# Patient Record
Sex: Female | Born: 1935 | Race: White | Hispanic: No | Smoking: Never smoker
Health system: Southern US, Community
[De-identification: ages and names within clinical notes are randomized; demographics above are authoritative.]

## PROBLEM LIST (undated history)

## (undated) DIAGNOSIS — F329 Major depressive disorder, single episode, unspecified: Secondary | ICD-10-CM

## (undated) DIAGNOSIS — M6282 Rhabdomyolysis: Secondary | ICD-10-CM

## (undated) DIAGNOSIS — I10 Essential (primary) hypertension: Secondary | ICD-10-CM

## (undated) DIAGNOSIS — N182 Chronic kidney disease, stage 2 (mild): Secondary | ICD-10-CM

## (undated) DIAGNOSIS — F419 Anxiety disorder, unspecified: Secondary | ICD-10-CM

## (undated) DIAGNOSIS — Z86718 Personal history of other venous thrombosis and embolism: Secondary | ICD-10-CM

## (undated) DIAGNOSIS — R339 Retention of urine, unspecified: Secondary | ICD-10-CM

## (undated) DIAGNOSIS — R000 Tachycardia, unspecified: Secondary | ICD-10-CM

## (undated) DIAGNOSIS — N390 Urinary tract infection, site not specified: Secondary | ICD-10-CM

## (undated) DIAGNOSIS — K5790 Diverticulosis of intestine, part unspecified, without perforation or abscess without bleeding: Secondary | ICD-10-CM

## (undated) DIAGNOSIS — L0390 Cellulitis, unspecified: Secondary | ICD-10-CM

## (undated) DIAGNOSIS — R55 Syncope and collapse: Secondary | ICD-10-CM

## (undated) DIAGNOSIS — J189 Pneumonia, unspecified organism: Secondary | ICD-10-CM

## (undated) DIAGNOSIS — I639 Cerebral infarction, unspecified: Secondary | ICD-10-CM

## (undated) DIAGNOSIS — F32A Depression, unspecified: Secondary | ICD-10-CM

## (undated) DIAGNOSIS — F0390 Unspecified dementia without behavioral disturbance: Secondary | ICD-10-CM

## (undated) DIAGNOSIS — N179 Acute kidney failure, unspecified: Secondary | ICD-10-CM

## (undated) DIAGNOSIS — F418 Other specified anxiety disorders: Secondary | ICD-10-CM

## (undated) HISTORY — PX: OVARIAN CYST REMOVAL: SHX89

## (undated) HISTORY — PX: TOTAL HIP ARTHROPLASTY: SHX124

## (undated) HISTORY — PX: OTHER SURGICAL HISTORY: SHX169

## (undated) HISTORY — PX: HEMORROIDECTOMY: SUR656

---

## 1997-05-16 ENCOUNTER — Ambulatory Visit (HOSPITAL_COMMUNITY): Admission: RE | Admit: 1997-05-16 | Discharge: 1997-05-16 | Payer: Self-pay | Admitting: Gastroenterology

## 1999-01-06 ENCOUNTER — Ambulatory Visit (HOSPITAL_COMMUNITY): Admission: RE | Admit: 1999-01-06 | Discharge: 1999-01-06 | Payer: Self-pay | Admitting: Gastroenterology

## 1999-04-06 ENCOUNTER — Encounter: Payer: Self-pay | Admitting: General Surgery

## 1999-04-08 ENCOUNTER — Encounter (INDEPENDENT_AMBULATORY_CARE_PROVIDER_SITE_OTHER): Payer: Self-pay

## 1999-04-08 ENCOUNTER — Inpatient Hospital Stay (HOSPITAL_COMMUNITY): Admission: RE | Admit: 1999-04-08 | Discharge: 1999-04-08 | Payer: Self-pay | Admitting: General Surgery

## 2000-08-04 ENCOUNTER — Other Ambulatory Visit: Admission: RE | Admit: 2000-08-04 | Discharge: 2000-08-04 | Payer: Self-pay | Admitting: Obstetrics and Gynecology

## 2000-09-04 ENCOUNTER — Ambulatory Visit (HOSPITAL_COMMUNITY): Admission: RE | Admit: 2000-09-04 | Discharge: 2000-09-04 | Payer: Self-pay | Admitting: Obstetrics and Gynecology

## 2000-11-15 ENCOUNTER — Encounter: Payer: Self-pay | Admitting: Internal Medicine

## 2000-11-15 ENCOUNTER — Ambulatory Visit (HOSPITAL_COMMUNITY): Admission: RE | Admit: 2000-11-15 | Discharge: 2000-11-15 | Payer: Self-pay | Admitting: Internal Medicine

## 2001-05-18 ENCOUNTER — Ambulatory Visit (HOSPITAL_COMMUNITY): Admission: RE | Admit: 2001-05-18 | Discharge: 2001-05-18 | Payer: Self-pay | Admitting: Internal Medicine

## 2001-05-18 ENCOUNTER — Encounter: Payer: Self-pay | Admitting: Internal Medicine

## 2001-08-13 ENCOUNTER — Encounter: Payer: Self-pay | Admitting: Internal Medicine

## 2001-08-13 ENCOUNTER — Ambulatory Visit (HOSPITAL_COMMUNITY): Admission: RE | Admit: 2001-08-13 | Discharge: 2001-08-13 | Payer: Self-pay | Admitting: Internal Medicine

## 2001-11-06 ENCOUNTER — Encounter (HOSPITAL_COMMUNITY): Admission: RE | Admit: 2001-11-06 | Discharge: 2001-12-06 | Payer: Self-pay | Admitting: Neurological Surgery

## 2001-11-15 ENCOUNTER — Encounter: Payer: Self-pay | Admitting: Internal Medicine

## 2001-11-15 ENCOUNTER — Ambulatory Visit (HOSPITAL_COMMUNITY): Admission: RE | Admit: 2001-11-15 | Discharge: 2001-11-15 | Payer: Self-pay | Admitting: Internal Medicine

## 2001-12-20 ENCOUNTER — Encounter: Admission: RE | Admit: 2001-12-20 | Discharge: 2001-12-20 | Payer: Self-pay | Admitting: Orthopedic Surgery

## 2001-12-20 ENCOUNTER — Encounter: Payer: Self-pay | Admitting: Orthopedic Surgery

## 2002-01-10 ENCOUNTER — Encounter: Payer: Self-pay | Admitting: Orthopedic Surgery

## 2002-01-10 ENCOUNTER — Encounter: Admission: RE | Admit: 2002-01-10 | Discharge: 2002-01-10 | Payer: Self-pay | Admitting: Orthopedic Surgery

## 2002-01-24 ENCOUNTER — Encounter: Admission: RE | Admit: 2002-01-24 | Discharge: 2002-01-24 | Payer: Self-pay | Admitting: Orthopedic Surgery

## 2002-01-24 ENCOUNTER — Encounter: Payer: Self-pay | Admitting: Orthopedic Surgery

## 2002-06-06 ENCOUNTER — Ambulatory Visit (HOSPITAL_COMMUNITY): Admission: RE | Admit: 2002-06-06 | Discharge: 2002-06-06 | Payer: Self-pay | Admitting: Neurological Surgery

## 2002-06-06 ENCOUNTER — Encounter: Payer: Self-pay | Admitting: Neurological Surgery

## 2002-06-24 ENCOUNTER — Inpatient Hospital Stay (HOSPITAL_COMMUNITY): Admission: RE | Admit: 2002-06-24 | Discharge: 2002-06-27 | Payer: Self-pay | Admitting: Neurological Surgery

## 2002-06-24 ENCOUNTER — Encounter: Payer: Self-pay | Admitting: Neurological Surgery

## 2002-11-20 ENCOUNTER — Ambulatory Visit (HOSPITAL_COMMUNITY): Admission: RE | Admit: 2002-11-20 | Discharge: 2002-11-20 | Payer: Self-pay | Admitting: Internal Medicine

## 2002-11-20 ENCOUNTER — Encounter: Payer: Self-pay | Admitting: Internal Medicine

## 2003-11-24 ENCOUNTER — Ambulatory Visit (HOSPITAL_COMMUNITY): Admission: RE | Admit: 2003-11-24 | Discharge: 2003-11-24 | Payer: Self-pay | Admitting: Internal Medicine

## 2004-12-22 ENCOUNTER — Ambulatory Visit (HOSPITAL_COMMUNITY): Admission: RE | Admit: 2004-12-22 | Discharge: 2004-12-22 | Payer: Self-pay | Admitting: Internal Medicine

## 2006-01-20 ENCOUNTER — Ambulatory Visit (HOSPITAL_COMMUNITY): Admission: RE | Admit: 2006-01-20 | Discharge: 2006-01-20 | Payer: Self-pay | Admitting: Family Medicine

## 2006-07-11 ENCOUNTER — Ambulatory Visit: Payer: Self-pay | Admitting: Internal Medicine

## 2006-07-24 ENCOUNTER — Ambulatory Visit: Payer: Self-pay | Admitting: Internal Medicine

## 2006-07-24 ENCOUNTER — Ambulatory Visit (HOSPITAL_COMMUNITY): Admission: RE | Admit: 2006-07-24 | Discharge: 2006-07-24 | Payer: Self-pay | Admitting: Internal Medicine

## 2006-09-14 ENCOUNTER — Ambulatory Visit (HOSPITAL_COMMUNITY): Admission: RE | Admit: 2006-09-14 | Discharge: 2006-09-14 | Payer: Self-pay | Admitting: Family Medicine

## 2006-09-14 ENCOUNTER — Encounter: Payer: Self-pay | Admitting: Orthopedic Surgery

## 2006-09-19 ENCOUNTER — Ambulatory Visit: Payer: Self-pay | Admitting: Urgent Care

## 2006-09-26 ENCOUNTER — Ambulatory Visit: Payer: Self-pay | Admitting: Internal Medicine

## 2006-09-28 ENCOUNTER — Ambulatory Visit (HOSPITAL_COMMUNITY): Admission: RE | Admit: 2006-09-28 | Discharge: 2006-09-28 | Payer: Self-pay | Admitting: Internal Medicine

## 2006-09-28 ENCOUNTER — Ambulatory Visit: Payer: Self-pay | Admitting: Internal Medicine

## 2006-10-06 ENCOUNTER — Ambulatory Visit: Payer: Self-pay | Admitting: Internal Medicine

## 2006-11-07 ENCOUNTER — Ambulatory Visit: Payer: Self-pay | Admitting: Internal Medicine

## 2007-01-08 ENCOUNTER — Ambulatory Visit (HOSPITAL_COMMUNITY): Admission: RE | Admit: 2007-01-08 | Discharge: 2007-01-08 | Payer: Self-pay | Admitting: Internal Medicine

## 2007-01-11 ENCOUNTER — Ambulatory Visit: Payer: Self-pay | Admitting: Internal Medicine

## 2007-01-12 ENCOUNTER — Ambulatory Visit (HOSPITAL_COMMUNITY): Admission: RE | Admit: 2007-01-12 | Discharge: 2007-01-12 | Payer: Self-pay | Admitting: Internal Medicine

## 2007-02-15 ENCOUNTER — Ambulatory Visit (HOSPITAL_COMMUNITY): Admission: RE | Admit: 2007-02-15 | Discharge: 2007-02-15 | Payer: Self-pay | Admitting: Family Medicine

## 2007-04-12 ENCOUNTER — Encounter: Payer: Self-pay | Admitting: Orthopedic Surgery

## 2007-04-12 ENCOUNTER — Ambulatory Visit (HOSPITAL_COMMUNITY): Admission: RE | Admit: 2007-04-12 | Discharge: 2007-04-12 | Payer: Self-pay | Admitting: Family Medicine

## 2007-08-06 ENCOUNTER — Ambulatory Visit: Payer: Self-pay | Admitting: Orthopedic Surgery

## 2007-08-06 DIAGNOSIS — M1610 Unilateral primary osteoarthritis, unspecified hip: Secondary | ICD-10-CM | POA: Insufficient documentation

## 2007-08-06 DIAGNOSIS — M169 Osteoarthritis of hip, unspecified: Secondary | ICD-10-CM

## 2007-08-06 DIAGNOSIS — M25559 Pain in unspecified hip: Secondary | ICD-10-CM

## 2008-02-11 ENCOUNTER — Inpatient Hospital Stay (HOSPITAL_COMMUNITY): Admission: RE | Admit: 2008-02-11 | Discharge: 2008-02-17 | Payer: Self-pay | Admitting: Orthopedic Surgery

## 2008-02-15 ENCOUNTER — Ambulatory Visit: Payer: Self-pay | Admitting: Internal Medicine

## 2010-02-28 ENCOUNTER — Encounter: Payer: Self-pay | Admitting: Internal Medicine

## 2010-05-24 LAB — RETICULOCYTES: Retic Ct Pct: 2.4 % (ref 0.4–3.1)

## 2010-05-24 LAB — CBC
HCT: 18.5 % — ABNORMAL LOW (ref 36.0–46.0)
HCT: 24.7 % — ABNORMAL LOW (ref 36.0–46.0)
HCT: 25.4 % — ABNORMAL LOW (ref 36.0–46.0)
HCT: 26.1 % — ABNORMAL LOW (ref 36.0–46.0)
Hemoglobin: 6.4 g/dL — CL (ref 12.0–15.0)
Hemoglobin: 8.6 g/dL — ABNORMAL LOW (ref 12.0–15.0)
Hemoglobin: 8.6 g/dL — ABNORMAL LOW (ref 12.0–15.0)
Hemoglobin: 8.9 g/dL — ABNORMAL LOW (ref 12.0–15.0)
MCHC: 34.3 g/dL (ref 30.0–36.0)
MCHC: 34.6 g/dL (ref 30.0–36.0)
MCHC: 34.7 g/dL (ref 30.0–36.0)
MCV: 91.1 fL (ref 78.0–100.0)
MCV: 91.8 fL (ref 78.0–100.0)
MCV: 92.3 fL (ref 78.0–100.0)
MCV: 92.3 fL (ref 78.0–100.0)
MCV: 92.5 fL (ref 78.0–100.0)
Platelets: 116 10*3/uL — ABNORMAL LOW (ref 150–400)
Platelets: 120 10*3/uL — ABNORMAL LOW (ref 150–400)
Platelets: 161 10*3/uL (ref 150–400)
RBC: 2.01 MIL/uL — ABNORMAL LOW (ref 3.87–5.11)
RBC: 2.59 MIL/uL — ABNORMAL LOW (ref 3.87–5.11)
RBC: 2.71 MIL/uL — ABNORMAL LOW (ref 3.87–5.11)
RBC: 2.82 MIL/uL — ABNORMAL LOW (ref 3.87–5.11)
RDW: 12.8 % (ref 11.5–15.5)
RDW: 13 % (ref 11.5–15.5)
RDW: 13.3 % (ref 11.5–15.5)
RDW: 13.4 % (ref 11.5–15.5)
WBC: 5.8 10*3/uL (ref 4.0–10.5)
WBC: 8.3 10*3/uL (ref 4.0–10.5)
WBC: 8.5 10*3/uL (ref 4.0–10.5)
WBC: 9.5 10*3/uL (ref 4.0–10.5)

## 2010-05-24 LAB — PROTIME-INR
INR: 1.2 (ref 0.00–1.49)
INR: 1.4 (ref 0.00–1.49)
INR: 1.4 (ref 0.00–1.49)
INR: 1.5 (ref 0.00–1.49)
INR: 1.6 — ABNORMAL HIGH (ref 0.00–1.49)
INR: 2 — ABNORMAL HIGH (ref 0.00–1.49)
Prothrombin Time: 15.2 seconds (ref 11.6–15.2)
Prothrombin Time: 15.8 seconds — ABNORMAL HIGH (ref 11.6–15.2)
Prothrombin Time: 17.3 seconds — ABNORMAL HIGH (ref 11.6–15.2)
Prothrombin Time: 17.3 seconds — ABNORMAL HIGH (ref 11.6–15.2)
Prothrombin Time: 18.3 seconds — ABNORMAL HIGH (ref 11.6–15.2)
Prothrombin Time: 19.8 seconds — ABNORMAL HIGH (ref 11.6–15.2)

## 2010-05-24 LAB — COMPREHENSIVE METABOLIC PANEL
ALT: 23 U/L (ref 0–35)
AST: 51 U/L — ABNORMAL HIGH (ref 0–37)
Albumin: 2.6 g/dL — ABNORMAL LOW (ref 3.5–5.2)
Alkaline Phosphatase: 48 U/L (ref 39–117)
BUN: 15 mg/dL (ref 6–23)
CO2: 27 mEq/L (ref 19–32)
Calcium: 7.9 mg/dL — ABNORMAL LOW (ref 8.4–10.5)
Chloride: 99 mEq/L (ref 96–112)
Creatinine, Ser: 0.96 mg/dL (ref 0.4–1.2)
GFR calc Af Amer: >60 mL/min (ref >60)
GFR calc non Af Amer: 57 mL/min — ABNORMAL LOW (ref >60)
Glucose, Bld: 205 mg/dL — ABNORMAL HIGH (ref 70–99)
Potassium: 3.3 mEq/L — ABNORMAL LOW (ref 3.5–5.1)
Sodium: 134 mEq/L — ABNORMAL LOW (ref 135–145)
Total Bilirubin: 1.2 mg/dL (ref 0.3–1.2)
Total Protein: 5.6 g/dL — ABNORMAL LOW (ref 6.0–8.3)

## 2010-05-24 LAB — CULTURE, BLOOD (ROUTINE X 2)
Culture: NO GROWTH
Culture: NO GROWTH

## 2010-05-24 LAB — DIFFERENTIAL
Lymphocytes Relative: 22 % (ref 12–46)
Lymphs Abs: 1.3 10*3/uL (ref 0.7–4.0)
Monocytes Relative: 12 % (ref 3–12)
Neutro Abs: 3.5 10*3/uL (ref 1.7–7.7)
Neutrophils Relative %: 61 % (ref 43–77)

## 2010-05-24 LAB — URINE MICROSCOPIC-ADD ON

## 2010-05-24 LAB — IRON AND TIBC
Iron: 19 ug/dL — ABNORMAL LOW (ref 42–135)
Saturation Ratios: 9 % — ABNORMAL LOW (ref 20–55)
TIBC: 208 ug/dL — ABNORMAL LOW (ref 250–470)
UIBC: 189 ug/dL

## 2010-05-24 LAB — URINE CULTURE
Colony Count: >=100000
Culture: NO GROWTH

## 2010-05-24 LAB — BASIC METABOLIC PANEL
Chloride: 100 mEq/L (ref 96–112)
Chloride: 104 mEq/L (ref 96–112)
Creatinine, Ser: 0.96 mg/dL (ref 0.4–1.2)
GFR calc Af Amer: >60 mL/min (ref >60)
GFR calc Af Amer: >60 mL/min (ref >60)
GFR calc Af Amer: >60 mL/min (ref >60)
GFR calc non Af Amer: 57 mL/min — ABNORMAL LOW (ref >60)
GFR calc non Af Amer: 59 mL/min — ABNORMAL LOW (ref >60)
Potassium: 3.9 mEq/L (ref 3.5–5.1)
Sodium: 139 mEq/L (ref 135–145)

## 2010-05-24 LAB — URINALYSIS, ROUTINE W REFLEX MICROSCOPIC
Bilirubin Urine: NEGATIVE
Nitrite: NEGATIVE
Protein, ur: 30 mg/dL — AB
Specific Gravity, Urine: 1.015 (ref 1.005–1.030)
Urobilinogen, UA: 1 mg/dL (ref 0.0–1.0)

## 2010-05-24 LAB — FOLATE: Folate: 12.1 ng/mL

## 2010-05-26 ENCOUNTER — Inpatient Hospital Stay (HOSPITAL_COMMUNITY)
Admission: EM | Admit: 2010-05-26 | Discharge: 2010-06-02 | DRG: 689 | Disposition: A | Discharge status: Skilled Nursing Facility | Payer: Medicare Other | Attending: Internal Medicine | Admitting: Internal Medicine

## 2010-05-26 ENCOUNTER — Emergency Department (HOSPITAL_COMMUNITY): Payer: Medicare Other

## 2010-05-26 DIAGNOSIS — E860 Dehydration: Secondary | ICD-10-CM | POA: Diagnosis present

## 2010-05-26 DIAGNOSIS — F411 Generalized anxiety disorder: Secondary | ICD-10-CM | POA: Diagnosis present

## 2010-05-26 DIAGNOSIS — M6282 Rhabdomyolysis: Secondary | ICD-10-CM | POA: Diagnosis present

## 2010-05-26 DIAGNOSIS — N319 Neuromuscular dysfunction of bladder, unspecified: Secondary | ICD-10-CM | POA: Diagnosis present

## 2010-05-26 DIAGNOSIS — R5381 Other malaise: Secondary | ICD-10-CM | POA: Diagnosis present

## 2010-05-26 DIAGNOSIS — Z681 Body mass index (BMI) 19 or less, adult: Secondary | ICD-10-CM

## 2010-05-26 DIAGNOSIS — N390 Urinary tract infection, site not specified: Principal | ICD-10-CM | POA: Diagnosis present

## 2010-05-26 DIAGNOSIS — R627 Adult failure to thrive: Secondary | ICD-10-CM | POA: Diagnosis present

## 2010-05-26 DIAGNOSIS — R339 Retention of urine, unspecified: Secondary | ICD-10-CM | POA: Diagnosis present

## 2010-05-26 DIAGNOSIS — F0390 Unspecified dementia without behavioral disturbance: Secondary | ICD-10-CM | POA: Diagnosis present

## 2010-05-26 DIAGNOSIS — Z86718 Personal history of other venous thrombosis and embolism: Secondary | ICD-10-CM

## 2010-05-26 DIAGNOSIS — E440 Moderate protein-calorie malnutrition: Secondary | ICD-10-CM | POA: Diagnosis present

## 2010-05-26 DIAGNOSIS — J189 Pneumonia, unspecified organism: Secondary | ICD-10-CM | POA: Diagnosis present

## 2010-05-26 DIAGNOSIS — N179 Acute kidney failure, unspecified: Secondary | ICD-10-CM | POA: Diagnosis present

## 2010-05-26 DIAGNOSIS — F05 Delirium due to known physiological condition: Secondary | ICD-10-CM | POA: Diagnosis present

## 2010-05-26 DIAGNOSIS — Z66 Do not resuscitate: Secondary | ICD-10-CM | POA: Diagnosis present

## 2010-05-26 DIAGNOSIS — E876 Hypokalemia: Secondary | ICD-10-CM | POA: Diagnosis not present

## 2010-05-26 LAB — BASIC METABOLIC PANEL
BUN: 24 mg/dL — ABNORMAL HIGH (ref 6–23)
Calcium: 8.8 mg/dL (ref 8.4–10.5)
GFR calc non Af Amer: 36 mL/min — ABNORMAL LOW (ref >60)
Glucose, Bld: 131 mg/dL — ABNORMAL HIGH (ref 70–99)
Potassium: 4.1 mEq/L (ref 3.5–5.1)
Sodium: 140 mEq/L (ref 135–145)

## 2010-05-26 LAB — CK TOTAL AND CKMB (NOT AT ARMC)
CK, MB: 47.8 ng/mL (ref 0.3–4.0)
Relative Index: 2.7 — ABNORMAL HIGH (ref 0.0–2.5)
Total CK: 1753 U/L — ABNORMAL HIGH (ref 7–177)

## 2010-05-26 LAB — URINALYSIS, ROUTINE W REFLEX MICROSCOPIC
Bilirubin Urine: NEGATIVE
Ketones, ur: 15 mg/dL — AB
Leukocytes, UA: NEGATIVE
Nitrite: NEGATIVE
Urobilinogen, UA: 0.2 mg/dL (ref 0.0–1.0)
pH: 5.5 (ref 5.0–8.0)

## 2010-05-26 LAB — TROPONIN I: Troponin I: 0.05 ng/mL (ref 0.00–0.06)

## 2010-05-26 LAB — DIFFERENTIAL
Basophils Absolute: 0 10*3/uL (ref 0.0–0.1)
Eosinophils Absolute: 0 10*3/uL (ref 0.0–0.7)
Eosinophils Relative: 0 % (ref 0–5)
Monocytes Absolute: 0.9 10*3/uL (ref 0.1–1.0)

## 2010-05-26 LAB — GLUCOSE, CAPILLARY

## 2010-05-26 LAB — CBC
HCT: 35.9 % — ABNORMAL LOW (ref 36.0–46.0)
MCHC: 33.4 g/dL (ref 30.0–36.0)
MCV: 93.2 fL (ref 78.0–100.0)
Platelets: 136 10*3/uL — ABNORMAL LOW (ref 150–400)
RDW: 14.1 % (ref 11.5–15.5)
WBC: 13.8 10*3/uL — ABNORMAL HIGH (ref 4.0–10.5)

## 2010-05-26 LAB — POCT CARDIAC MARKERS: Troponin i, poc: <0.05 ng/mL (ref 0.00–0.09)

## 2010-05-27 ENCOUNTER — Inpatient Hospital Stay (HOSPITAL_COMMUNITY): Payer: Medicare Other

## 2010-05-27 ENCOUNTER — Other Ambulatory Visit (HOSPITAL_COMMUNITY): Payer: Medicare Other

## 2010-05-27 DIAGNOSIS — I369 Nonrheumatic tricuspid valve disorder, unspecified: Secondary | ICD-10-CM

## 2010-05-27 LAB — CARDIAC PANEL(CRET KIN+CKTOT+MB+TROPI)
CK, MB: 10.1 ng/mL (ref 0.3–4.0)
Relative Index: 1.1 (ref 0.0–2.5)
Relative Index: 0.9 (ref 0.0–2.5)
Relative Index: 0.8 (ref 0.0–2.5)
Total CK: 1194 U/L — ABNORMAL HIGH (ref 7–177)
Troponin I: 0.05 ng/mL (ref 0.00–0.06)

## 2010-05-27 LAB — COMPREHENSIVE METABOLIC PANEL
AST: 84 U/L — ABNORMAL HIGH (ref 0–37)
Albumin: 2.9 g/dL — ABNORMAL LOW (ref 3.5–5.2)
Alkaline Phosphatase: 38 U/L — ABNORMAL LOW (ref 39–117)
BUN: 20 mg/dL (ref 6–23)
Chloride: 104 mEq/L (ref 96–112)
GFR calc Af Amer: >60 mL/min (ref >60)
Potassium: 3.4 mEq/L — ABNORMAL LOW (ref 3.5–5.1)
Total Bilirubin: 0.5 mg/dL (ref 0.3–1.2)
Total Protein: 5.2 g/dL — ABNORMAL LOW (ref 6.0–8.3)

## 2010-05-27 LAB — T4, FREE: Free T4: 1.12 ng/dL (ref 0.80–1.80)

## 2010-05-27 LAB — FERRITIN: Ferritin: 132 ng/mL (ref 10–291)

## 2010-05-27 LAB — DIFFERENTIAL
Basophils Absolute: 0 10*3/uL (ref 0.0–0.1)
Basophils Relative: 0 % (ref 0–1)
Eosinophils Absolute: 0.3 10*3/uL (ref 0.0–0.7)
Eosinophils Relative: 3 % (ref 0–5)
Neutrophils Relative %: 68 % (ref 43–77)

## 2010-05-27 LAB — CBC
MCV: 92.9 fL (ref 78.0–100.0)
Platelets: 146 10*3/uL — ABNORMAL LOW (ref 150–400)
RBC: 4.09 MIL/uL (ref 3.87–5.11)
RDW: 14.1 % (ref 11.5–15.5)
WBC: 10.9 10*3/uL — ABNORMAL HIGH (ref 4.0–10.5)

## 2010-05-27 LAB — TSH: TSH: 3.944 u[IU]/mL (ref 0.350–4.500)

## 2010-05-27 LAB — T3, FREE: T3, Free: 2.1 pg/mL — ABNORMAL LOW (ref 2.3–4.2)

## 2010-05-27 LAB — VITAMIN B12: Vitamin B-12: 335 pg/mL (ref 211–911)

## 2010-05-27 MED ORDER — GADOBENATE DIMEGLUMINE 529 MG/ML IV SOLN
20.0000 mL | Freq: Once | INTRAVENOUS | Status: AC | PRN
  Administered 2010-05-27: 10 mL via INTRAVENOUS

## 2010-05-28 ENCOUNTER — Inpatient Hospital Stay (HOSPITAL_COMMUNITY): Payer: Medicare Other

## 2010-05-28 LAB — URINE CULTURE: Culture  Setup Time: 201204192000

## 2010-05-28 LAB — FOLATE RBC: RBC Folate: 890 ng/mL — ABNORMAL HIGH (ref >=366)

## 2010-05-28 MED ORDER — IOHEXOL 300 MG/ML  SOLN
100.0000 mL | Freq: Once | INTRAMUSCULAR | Status: AC | PRN
  Administered 2010-05-28: 100 mL via INTRAVENOUS

## 2010-05-29 LAB — CBC
Hemoglobin: 12.7 g/dL (ref 12.0–15.0)
MCH: 30.7 pg (ref 26.0–34.0)
MCV: 92 fL (ref 78.0–100.0)
RBC: 4.14 MIL/uL (ref 3.87–5.11)

## 2010-05-29 LAB — DIFFERENTIAL
Eosinophils Absolute: 0.3 10*3/uL (ref 0.0–0.7)
Lymphs Abs: 2.1 10*3/uL (ref 0.7–4.0)
Monocytes Relative: 8 % (ref 3–12)
Neutro Abs: 8.3 10*3/uL — ABNORMAL HIGH (ref 1.7–7.7)
Neutrophils Relative %: 71 % (ref 43–77)

## 2010-05-29 LAB — BASIC METABOLIC PANEL
BUN: 12 mg/dL (ref 6–23)
CO2: 23 mEq/L (ref 19–32)
Chloride: 106 mEq/L (ref 96–112)
Creatinine, Ser: 0.92 mg/dL (ref 0.4–1.2)

## 2010-05-29 NOTE — Procedures (Signed)
[name], AARON               ACCOUNT NO.:  [id number]  MEDICAL RECORD NO.:  [id number]           PATIENT TYPE:  I  LOCATION:  A332                          FACILITY:  [facility]  PHYSICIAN:  Oaklyn Mans A. Gerilyn Pilgrim, M.D. DATE OF BIRTH:  [birth year]  DATE OF PROCEDURE:  05/27/2010 DATE OF DISCHARGE:                             EEG INTERPRETATION   REFERRING PHYSICIAN:  [facility].  RECORDING DATE:  May 27, 2010.  INDICATIONS:  A 79-year lady who presents with altered mentation, hallucination, and confusion.  This study has been done to evaluate for seizures.  MEDICATIONS:  Aspirin, Rocephin, Ativan, Zofran, Ventolin.  ANALYSIS:  A 16-channel recording using standard 10/20 system is obtained for 22 minutes.  The posterior dominant rhythm gets as high as 6.5 to 7 Hz.  Awake and sleep architecture are observed.  Stage II sleep with K complexes and sleep spindles are recorded.  Photic stimulation is carried out without abnormal changes in the background activity.  There is no focal or lateralized slowing.  There is no epileptiform activity observed.  IMPRESSION:  Moderate generalized slowing, however, no epileptiform activity observed.     Faylene Allerton A. Gerilyn Pilgrim, M.D.     KAD/MEDQ  D:  05/29/2010  T:  05/29/2010  Job:  [id number]

## 2010-05-30 LAB — URINALYSIS, ROUTINE W REFLEX MICROSCOPIC
Bilirubin Urine: NEGATIVE
Specific Gravity, Urine: 1.02 (ref 1.005–1.030)
pH: 6 (ref 5.0–8.0)

## 2010-05-30 LAB — URINE MICROSCOPIC-ADD ON

## 2010-05-31 ENCOUNTER — Inpatient Hospital Stay (HOSPITAL_COMMUNITY): Payer: Medicare Other

## 2010-05-31 LAB — CBC
Hemoglobin: 11.7 g/dL — ABNORMAL LOW (ref 12.0–15.0)
MCHC: 34 g/dL (ref 30.0–36.0)
Platelets: 181 10*3/uL (ref 150–400)

## 2010-05-31 LAB — URINE CULTURE: Colony Count: NO GROWTH

## 2010-05-31 LAB — DIFFERENTIAL
Basophils Absolute: 0.1 10*3/uL (ref 0.0–0.1)
Basophils Relative: 1 % (ref 0–1)
Monocytes Absolute: 0.8 10*3/uL (ref 0.1–1.0)
Neutro Abs: 5.3 10*3/uL (ref 1.7–7.7)
Neutrophils Relative %: 56 % (ref 43–77)

## 2010-05-31 LAB — BASIC METABOLIC PANEL
CO2: 26 mEq/L (ref 19–32)
Calcium: 7.9 mg/dL — ABNORMAL LOW (ref 8.4–10.5)
GFR calc Af Amer: >60 mL/min (ref >60)
GFR calc non Af Amer: 58 mL/min — ABNORMAL LOW (ref >60)
Sodium: 140 mEq/L (ref 135–145)

## 2010-05-31 LAB — CK: Total CK: 255 U/L — ABNORMAL HIGH (ref 7–177)

## 2010-05-31 LAB — MAGNESIUM: Magnesium: 1.8 mg/dL (ref 1.5–2.5)

## 2010-06-01 LAB — CULTURE, BLOOD (ROUTINE X 2)

## 2010-06-02 LAB — BASIC METABOLIC PANEL
CO2: 27 mEq/L (ref 19–32)
Calcium: 8 mg/dL — ABNORMAL LOW (ref 8.4–10.5)
Chloride: 105 mEq/L (ref 96–112)
Creatinine, Ser: 0.84 mg/dL (ref 0.4–1.2)
Glucose, Bld: 91 mg/dL (ref 70–99)

## 2010-06-02 LAB — CBC
HCT: 31.6 % — ABNORMAL LOW (ref 36.0–46.0)
Hemoglobin: 10.6 g/dL — ABNORMAL LOW (ref 12.0–15.0)
MCH: 30.9 pg (ref 26.0–34.0)
MCHC: 33.5 g/dL (ref 30.0–36.0)
MCV: 92.1 fL (ref 78.0–100.0)

## 2010-06-02 LAB — DIFFERENTIAL
Lymphocytes Relative: 40 % (ref 12–46)
Lymphs Abs: 3.5 10*3/uL (ref 0.7–4.0)
Monocytes Absolute: 0.7 10*3/uL (ref 0.1–1.0)
Monocytes Relative: 8 % (ref 3–12)
Neutro Abs: 3.6 10*3/uL (ref 1.7–7.7)

## 2010-06-02 NOTE — Discharge Summary (Signed)
NAMEKENDRAH, [name]               ACCOUNT NO.:  [id number]  MEDICAL RECORD NO.:  [id number]           PATIENT TYPE:  I  LOCATION:  A332                          FACILITY:  [facility]  PHYSICIAN:  Elliot Cousin, M.D.    DATE OF BIRTH:  09/25/1930  DATE OF ADMISSION:  05/26/2010 DATE OF DISCHARGE:  LH                         DISCHARGE SUMMARY-REFERRING   DATE OF ANTICIPATED DISCHARGE:  June 02, 2010  DISCHARGE DIAGNOSES: 1. The patient was found down at home, syncope versus presyncope. 2. Severe debility and associated generalized weakness. 3. Dementia with delirium and history of visual hallucinations. 4. Culture negative urinary tract infection. 5. Right lower lobe pneumonia. 6. Moderate bilateral pleural effusions per CT scan of the abdomen and     pelvis on May 28, 2010. 7. Rhabdomyolysis secondary to being found down at home, likely     secondary to a fall.  The patient's total CK was 1753 on admission     and 255 at the time of hospital discharge. 8. Acute renal failure secondary to prerenal azotemia.  The patient's     BUN was 24 and her creatinine was 1.42 on admission.  As of today,     her BUN is 12 and her creatinine is 0.94. 9. Hypokalemia. 10.Body wall edema per CT scan of the abdomen and pelvis status post     IV fluid volume repletion. 11.Distended bladder with possible neurogenic bladder and urinary     retention.  The patient was discharged with an inserted Foley     catheter. 12.Chronic constipation. 13.Sequelae of a previous DVT in both lower extremities, but no     evidence of acute DVT per venous duplex ultrasound of bilateral     lower extremities on May 27, 2010. 14.Moderate protein-calorie malnutrition. 15.DNR status.  DISCHARGE MEDICATIONS: 1. Ceftin 250 mg b.i.d. for two more days. 2. Azithromycin 250 mg daily for two more days. 3. Multivitamin with iron once daily. 4. Aspirin 81 mg daily. 5. Ensure supplement 1 can p.o. twice daily. 6.  Potassium chloride 20 mEq daily. 7. Senokot-S 1 tablet b.i.d. 8. MiraLax laxative 17 g in 6 or 8 ounces of beverage daily (hold for     diarrhea). 9. Haldol 1-2 mg IM/p.o./IV q.4 h p.r.n. confusion and sundowning. 10.Dulcolax suppository daily p.r.n.  CONSULTATIONS:  Ky Barban, M.D.  PROCEDURE PERFORMED: 1. Abdominal x-ray on May 31, 2010.  The results revealed small     fecal impaction in the rectum.  Otherwise, no acute abnormality of     the abdomen. 2. CT scan of the abdomen and pelvis on May 28, 2010.  The results     revealed moderate bilateral pleural effusions with associated lower     lobe atelectasis, superimposed right lower lobe opacity is     suspected, distended bladder with resultant fullness of the     bilateral collecting systems, no evidence of small bowel     obstruction, and moderate stool in the rectum. 3. MRI of the brain with and without contrast on May 27, 2010.  The     results revealed no evidence for acute or  subacute infection.     Atrophy and white matter disease likely reflecting sequelae of     chronic microvascular ischemia.  Mild diffuse sinus disease in the     maxillary and left sphenoid sinuses. 4. Carotid ultrasound on May 27, 2010.  The results revealed mild     plaque formation bilaterally at the carotid bulb, but no evidence     of hemodynamically significant stenosis. 5. Venous duplex ultrasound of the bilateral lower extremities on     May 27, 2010.  The results revealed no definite evidence of acute     deep vein thrombosis.  Short segments of what appears to be old     organized thrombus is identified at the right femoral vein and left     common femoral vein, likely sequelae of prior episodes of DVT. 6. EEG on May 27, 2010.  The results revealed moderate generalized     slowing.  However, no epileptiform activity observed. 7. 2-D echocardiogram on May 27, 2010.  The results revealed mild     LVH, an ejection  fraction in the range of 60% to 65%, wall motion     was normal, systolic bowing of the mitral valve without prolapse.  HISTORY OF PRESENT ILLNESS:  The patient is a 77 year old woman with a past medical history significant for dysphagia, status post dilatation of the esophagus in 2010, diverticulosis, chronic kidney disease, and lumbar stenosis.  She presented to the emergency department on May 26, 2010 after being found down at her home.  She had been recently started on Cipro for a presumptive urinary tract infection.  She had also been experiencing visual hallucinations as reported by her son.  She was found by a home health aide and then subsequently her son relatively semiresponsive at home lying on the floor leaning against the bed.  She had stool and urine on her clothes.  It was unclear how long she had been on the floor, but it appeared to have been 24 hours or less.  In the emergency department, the patient was noted to be afebrile and hemodynamically stable.  Her chest x-ray revealed COPD with no active acute cardiopulmonary disease.  The CT scan of her head revealed mild chronic sinusitis and mild chronic microvascular ischemia, but no acute abnormalities.  Her WBC was elevated at 13.8.  Her BUN was elevated at 24 and her creatinine was elevated at 1.42.  Her micro urine revealed 11- 20 rbc's and many bacteria.  Her initial CK was 1753 with a normal troponin-I of 0.05.  She was admitted for further evaluation and management.  HOSPITAL COURSE: 1. THE PATIENT WAS FOUND DOWN AT HOME, LIKELY SECONDARY TO SYNCOPE OR     PRESYNCOPE.  The patient was started on IV fluid hydration with D5W     with bicarbonate added.  The IV fluids were later changed to normal     saline with potassium chloride added.  Aspirin was added     empirically for microvascular disease seen on the CT scan of her     head.  Rocephin was started empirically for a possible urinary     tract infection.  For  further evaluation, a number of studies were     ordered.  Her EEG revealed no epileptiform activity.  Her carotid     duplex ultrasound was negative for significant ICA stenosis.  The     MRI of her brain revealed no acute or subacute infarctions.  Her 2-  D echocardiogram revealed preserved LV function with no evidence of     regional wall motion abnormalities.  Her troponin I was negative     x3.  Her total CK was elevated initially at 1753.  The elevation     was associated with rhabdomyolysis from the apparent fall at home.     Otherwise, she ruled out for myocardial infarction with negative     troponin Is.  Her fasting lipid profile was within normal limits.     Her TSH was within normal limits at 3.9.  Her free T4 was within     normal limits at 1.12, although her free T3 was slightly low at     2.1.  Her hemoglobin A1c was 5.8, within normal limits.  Her RPR     was nonreactive.  Her vitamin B12 was within normal limits at 335.     The patient was intermittently interactive during the     hospitalization.  The physical therapist was consulted for     evaluation.  Based on her evaluation, she recommended     ongoing physical therapy in a skilled environment.  The plan is to     discharge her to a skilled nursing facility of choice tomorrow on     June 02, 2010.  She is currently in improved and stable condition,     although she is certainly debilitated and in general had been     failing to thrive at home. 2. DELIRIUM WITH DEMENTIA AND VISUAL HALLUCINATIONS.  The patient's     son Mr. Sama Arauz disclosed that the patient had been having     visual hallucinations at home.  Otherwise, she had not been     significantly confused.  These episodes of hallucinations started     after her husband died last year.  It is likely that the patient     has underlying dementia, which decompensated to delirium as a     consequence of dehydration and infections.  For the most part,      during the hospitalization, she was cooperative but at times she     did have episodes of sundowning, which was treated with as needed     Ativan and Haldol.  Following Ativan administration, the patient     was more lethargic and less interactive and therefore the dose of     Ativan was decreased significantly to only 0.25 mg as needed.  It     was discontinued prior to hospital discharge.  Haldol is the     treatment of choice for dementia-associated delirium.  Long-term     antipsychotic therapy can be considered along with either Aricept     or Namenda; however, these medications were not started. 3. URINARY TRACT INFECTION AND PNEUMONIA.  The patient was started on     Rocephin empirically for what was believed to be a urinary tract     infection.  The CT scan of her abdomen and pelvis later revealed a     possible right lower lobe pneumonia.  During the hospitalization,     she remained completely afebrile.  Her white blood cell count     normalized within 24 hours of her hospitalization.  Following the     revelation of the right lower lobe pneumonia, azithromycin was     added.  All cultures including two urine cultures and blood     cultures have remained negative.  As of today, she's  received 7     days of Rocephin and 2 days of azithromycin therapy.  After     tomorrow's doses, she will be discharged on 2 more days of     Ceftin and azithromycin. 4. URINARY RETENTION.  A Foley catheter was inserted.  The patient was     having difficulty urinating.  A CT of her abdomen and pelvis     revealed a distended bladder and moderate stool in addition to     other findings dictated above.  It was believed that constipation     was causing urinary retention.  After a series of laxatives and     enemas, she had multiple bowel movements.  The Foley catheter was     discontinued.  She was still unable to urinate spontaneously.  It     was reinserted shortly thereafter and it drained over a  liter of     urine.  Another urinalysis and culture were ordered, but the urine     culture was negative although the urinalysis did reveal wbc's.     Urologist Dr. Jerre Simon was consulted.  He evaluated the patient and     believed that she may have a neurogenic or weakened bladder due to     the overdistention and immobility.  He recommended keeping the     Foley catheter in and he would see her electively in his office in     a week or two for a cystoscopy.  Therefore, the patient was     discharged to the skilled nursing facility with a Foley catheter in     place.  Her appointment with Dr. Jerre Simon is on Wednesday Jun 09, 2010     at 10:30 a.m. 5. BILATERAL PLEURAL EFFUSIONS AND MILD BODY WALL EDEMA.  These     findings were seen on the CT scan of her abdomen and pelvis.  She     was treated gently with IV Lasix for 24 hours.  She did diurese     well.  There appears to be no gross peripheral edema on exam as of     the time of this dictation. 6. GENERALIZED ANXIETY, POOR ORAL INTAKE, AND MALNUTRITION.  The     patient had a poor appetite during the majority of hospitalization.     The nutritionist/registered dietitian was consulted.  She carried     out a calorie count.  Based on the results, she recommended Ensure     twice daily.  When her family was available, it appeared that she     did eat better.  Her malnutrition was thought to be moderate given     that her albumin was 2.9.  DISCHARGE DISPOSITION:  The patient is currently stable and in improved condition.  The plan is to discharge her to the skilled nursing facility of choice on June 02, 2010.  CODE STATUS:  DNR.  DIET:  Mechanical soft.  ACTIVITY:  As tolerated with physical therapy.  DISCHARGE INSTRUCTIONS:  Appointments with Dr. Jerre Simon on Jun 09, 2010 at 10:30 a.m.  The Foley catheter will remain in place until he reevaluates her in his office.     Elliot Cousin, M.D.     DF/MEDQ  D:  06/01/2010  T:   06/01/2010  Job:  [id number]  cc:   Madelin Rear. Sherwood Gambler, MD Fax: [phone]  Tesfaye D. Felecia Shelling, MD Fax: [phone]  Electronically Signed by Elliot Cousin M.D. on 06/02/2010 11:14:50 AM

## 2010-06-03 NOTE — H&P (Signed)
NAMEMarland Kitchen  [name], [name] NO.:  [id number]  MEDICAL RECORD NO.:  [id number]           PATIENT TYPE:  E  LOCATION:  [facility]ED                          FACILITY:  [facility]  PHYSICIAN:  Valetta Close, M.D.   DATE OF BIRTH:  11/12/1930  DATE OF ADMISSION:  05/26/2010 DATE OF DISCHARGE:  LH                             HISTORY & PHYSICAL   CHIEF COMPLAINT:  Being found down.  HISTORY OF PRESENT ILLNESS:  This is a 77 year old who for the last 14 months since the death of her husband has been having visual hallucinations of dead people, but that has been stable, who was seen by her physician for these same symptoms and was started on Cipro presumptively because she was unable to provide a urine sample.  Since starting on the Cipro, she had 5 bowel movements on April 16, but was otherwise doing the same.  On April 17, she continued to have frequent bowel movements, they were described as not being liquid.  Of note, [name] is unable to provide much history currently or unwilling and most of the history is obtained from the son.  This morning, she was found by her health aid to be unresponsive.  They were unable to get into the house, they had to get the family to come in and when they found her, she was on the ground leaning against the bed.  There was some stool on her and some dried urine as well.  She was down no more than 24 hours because she has been last seen acting relatively normally on April 17. They tried to get her to move around with extreme difficulty.  She was very weak especially on the right side.  She was not talking much, so they could not tell if she understood them or not, so they transferred her to the emergency room.  PAST MEDICAL HISTORY:  Dysphagia that improved with a dilation in 2010, sigmoid diverticula, chronic kidney disease stage 2 to 3, lumbar stenosis, a right total hip arthroplasty, hemorrhoids, and constipation.  She is a full code.  Her  primary care physician is [name].  ALLERGIES:  She has maybe an allergy to HYDROCODONE, but she took HYDROCODONE in the past.  MEDICATIONS:  Cipro 500 mg by mouth twice a day started on Monday and a multivitamin, that is all she takes.  REVIEW OF SYSTEMS:  Complete detailed review of systems was performed and it was notable for specifically the psych portion, which was notable for visual hallucination.  She sees dead people.  Sometimes she sees living people who again are clearly not there.  For instance, she was talking to her son over the phone several days ago and said that she saw him sitting in the chair across from her even though he was clearly not there.  These people that she sees almost never talk and if they do talk, she is unable to know what they say.  She also notes that if people enter the room, these visual hallucinations disappear.  This started almost at exactly the same time that her husband passed away. They  have been having hard time getting her to really do much around the house, and notes that she continues to have what seems like depression, but she has had no other complaints.  PHYSICAL EXAMINATION:  VITAL SIGNS:  Temperature 98.1, heart rate 106, blood pressure 137/54, O2 sat 97% on room air, respiratory rate 20. GENERAL:  She is emaciated appearing which is temporal wasting, but she is in no apparent distress.  She does have her head tilted to the right, which the family says is chronic for her. EYES:  No scleral icterus. ENT:  Very dry oral mucosa with poor dentition. LUNGS:  Coarse bilaterally, but she was speaking in full senses without difficulty. CARDIAC:  Regular rate and rhythm with no murmur. ABDOMEN:  Bowel sounds are positive.  No tenderness, rebound, or guarding. RECTAL:  Deferred. EXTREMITIES:  Notable for right leg swelling, 1 to 2+ edema.  The son says it has been there for several months and also trace left leg edema. NEUROLOGIC:  She  is on globally weak but otherwise the neuro exam is nonfocal. PSYCH:  She has a very flat blunted affect.  It takes her a while to answer questions, she answers them very slowly.  She does smile, but again very miniscule, really a flat blunted affect.  LABORATORY DATA:  White count 14, hemoglobin 12, hematocrit 36, platelets 136.  Sodium 140, potassium 4.1, chloride 105, bicarb 26, BUN 124, creatinine 1.42, glucose 131, calcium 8.8.  UA with blood and many bacteria, but negative nitrate, negative leukocyte esterase, and negative wbc's.  Troponins are negative.  CK is 1753, MB is 48, relative index is 2.7.  C diff is negative.  Chest x-ray shows chronic COPD.  ASSESSMENT AND PLAN: 1. Rule out transient ischemic attack/altered mental status.  Given     that she was found down, I am going to rule out transient ischemic     attack with an MRI.  The CT scan is negative.  I will get an EEG.     It is possible that she had her seizure threshold lowered by Cipro,     although seizure is less likely.  My differential level would     explain the elevated white count, but still we are having     rhabdomyolysis, so I would get an EEG but it will be in the     morning, get the MRI, get the carotid Dopplers.  I am going to get     a 2-D echo with bubble study also because of the lower extremity     edema and because of part of the rule-out TIA process.  I will put     her on a low dose of aspirin.  I am not going to put her on any     statin because of her rhabdomyolysis, and I will not put her on a     beta-blocker because I do not want to cause any change with her     blood pressure.  Again, I think a lot this was maybe urinary tract     infection, now the UA is negative because of the Cipro, I am going     to change to Rocephin.  Part of this may be just gradual worsening     of this patient with major depression.  I am going to try to     discuss the case with the psychiatrist and maybe start  her on a new  antidepressant going forward that might help with hallucinations     but make sure definitely to see a psychiatrist for this given the     psychotic nature of her depression, but I will rule out other     possible organic causes of her symptoms, which will include check     for syphilis, B12 deficiency, hypothyroidism or hyperthyroidism,     etc. 2. SIRS versus sepsis.  I think this is most likely SIRS and     dehydration.  I will hydrate with D5W with 3 amps of bicarb at     first for a liter and then normal saline.  Again hydrate     aggressively given the rhabdomyolysis and in case of sepsis though,     again, I do not think it is sepsis which I will only treat her with     Rocephin only for 5 days.  I will check a stool for C diff given     her multiple bowel movements although the fact that they were all     solid makes it unlikely it is C diff, but I will check anyway.  I     do not think she needs contact isolation at this time, however. 3. Rhabdomyolysis as above.  Hydrate aggressively and monitor LFTs. 4. Major depression.  I think she will definitely need outpatient     followup with the Psychiatry going forward based on whatever our     workup shows. 5. Question of acute-on-chronic kidney disease stage 2-3.  Again, I do     not have a real baseline for her.  She does clinically look     dehydrated, but I am going to give her hydration more specifically     for her rhabdomyolysis.     The last creatinine I see in the system is from February 17, 2008     and was notable of a creatinine of 0.93, so again I will treat this     as if it is acute-on-chronic kidney disease.  The approximate length of this admission was approximately 50 minutes.     Valetta Close, M.D.     JC/MEDQ  D:  05/26/2010  T:  05/26/2010  Job:  [id number]  cc:   Kirk Ruths, M.D. Fax: [phone]  R. Roetta Sessions, M.D. [address] [place] [address]  Madelin Rear. Sherwood Gambler,  MD Fax: [phone]  Electronically Signed by Valetta Close M.D. on 06/03/2010 06:42:44 AM

## 2010-06-14 ENCOUNTER — Other Ambulatory Visit (HOSPITAL_COMMUNITY): Payer: Self-pay | Admitting: Urology

## 2010-06-14 DIAGNOSIS — N1330 Unspecified hydronephrosis: Secondary | ICD-10-CM

## 2010-06-16 ENCOUNTER — Ambulatory Visit (HOSPITAL_COMMUNITY)
Admission: RE | Admit: 2010-06-16 | Discharge: 2010-06-16 | Disposition: A | Discharge status: Home / Self Care | Payer: Medicare Other | Source: Ambulatory Visit | Attending: Urology | Admitting: Urology

## 2010-06-16 DIAGNOSIS — N1330 Unspecified hydronephrosis: Secondary | ICD-10-CM | POA: Insufficient documentation

## 2010-06-16 DIAGNOSIS — Q619 Cystic kidney disease, unspecified: Secondary | ICD-10-CM | POA: Insufficient documentation

## 2010-06-20 ENCOUNTER — Emergency Department (HOSPITAL_COMMUNITY)
Admission: EM | Admit: 2010-06-20 | Discharge: 2010-06-21 | Disposition: A | Discharge status: Home / Self Care | Payer: Medicare Other | Attending: Emergency Medicine | Admitting: Emergency Medicine

## 2010-06-20 DIAGNOSIS — Z96649 Presence of unspecified artificial hip joint: Secondary | ICD-10-CM | POA: Insufficient documentation

## 2010-06-20 DIAGNOSIS — L272 Dermatitis due to ingested food: Secondary | ICD-10-CM | POA: Insufficient documentation

## 2010-06-20 DIAGNOSIS — Z79899 Other long term (current) drug therapy: Secondary | ICD-10-CM | POA: Insufficient documentation

## 2010-06-20 DIAGNOSIS — Z7982 Long term (current) use of aspirin: Secondary | ICD-10-CM | POA: Insufficient documentation

## 2010-06-22 NOTE — Op Note (Signed)
NAMEVIANN, NIELSON NO.:  [id number]   MEDICAL RECORD NO.:  [id number]          PATIENT TYPE:  INP   LOCATION:  5024                         FACILITY:  [facility]   PHYSICIAN:  Feliberto Gottron. Turner Daniels, M.D.   DATE OF BIRTH:  03-01-1930   DATE OF PROCEDURE:  02/11/2008  DATE OF DISCHARGE:                               OPERATIVE REPORT   PREOPERATIVE DIAGNOSIS:  End-stage arthritis of the right hip.   POSTOPERATIVE DIAGNOSIS:  End-stage arthritis of the right hip.   PROCEDURE:  Right total hip arthroplasty using DePuy 52-mm ASR cup, 16 x  11 x 150 x 36 S-ROM stem, 16D large cone, NK +2 46-mm ultimate head.   SURGEON:  Feliberto Gottron. Turner Daniels, MD   FIRST ASSISTANT:  Shirl Harris, PA-C   ANESTHETIC:  General endotracheal.   ESTIMATED BLOOD LOSS:  200 mL   FLUID REPLACEMENT:  1500 mL of crystalloid.   DRAINS PLACED:  Foley catheter.   URINE OUTPUT:  300 mL.   INDICATIONS FOR PROCEDURE:  A 77 year old woman with end-stage arthritis  of the right hip with mild protrusio and a straight medial acetabular  cyst.  She has been having bone-on-bone arthritic changes.  She has  failed conservative treatment, anti-inflammatory medicines, observation,  and judicious use of narcotics.  She has profound right-sided limp and  desires elective total hip arthroplasty to decrease pain and increase  function.  Risks and benefits of surgery are discussed and questions are  answered.   DESCRIPTION OF PROCEDURE:  The patient was identified by armband and  taken to the operating room at [facility] where the appropriate  anesthetic monitors were attached and general endotracheal anesthesia  was induced.  She received preoperative IV antibiotics and then had a  Foley catheter inserted.  She was then placed in a left lateral  decubitus position and fixated with a Spielberg  Mark II pelvic clamp  and the right lower extremity was prepped and draped in the usual  sterile fashion from the  ankle to the hemipelvis.  A standard time-out  procedure was then performed.  The skin along the lateral hip and thigh  was infiltrated with 20 mL of 0.5 Marcaine and epinephrine solution and  then a 15-cm skin incision centered over the greater trochanter was made  allowing a posterolateral approach to the hip joint.  Small bleeders in  the skin and subcutaneous tissue were identified and cauterized.  The IT  band was cut along with skin incision exposing the greater trochanter.  Cobra retractors were placed between the gluteus minimus and the  superior hip joint capsule and between the quadratus femoris and the  inferior joint capsule.  This isolated the piriformis and short external  rotators which were then cut off their insertion on the  intertrochanteric crest exposing the posterior aspect of the hip joint  capsule.  We then developed an acetabular-based flap in the capsule  starting at posterior-superior going out over the femoral neck near the  insertion of the capsule and then the inferiorly went back out to the  acetabulum creating an  acetabular-based capsular flap.  This was  likewise tagged with two #2 Ethibond sutures.  The 50% of the rectus  femoris origin was then released.  The hip was flexed and internally  rotated dislocating the arthritic femoral head which was down to bone-on-  bone with some mild cystic changes.  Standard neck cut was performed one  fingerbreadth above the lesser trochanter.  The proximal femur was  translated anteriorly levering off the anterior column of the pelvis  with a Homan retractor.  A posterior-inferior wing retractor was then  placed at the junction of the ischium and the acetabulum and then an  inferior Spiked Cobra retractor placed completing our acetabular  exposure.  The labrum was excised.  We then sequentially reamed up to a  51-mm basket reamer obtaining good coverage in all quadrants and noting  only some small cyst in the acetabulum  that did not require any  grafting.  At this point, we hammered into place a 52-mm DePuy ASR cup  in 45 degrees of abduction, 20 degrees of anteversion obtaining good fit  and fill and a good snug fit with no looseness of the socket.  At this  point, the hip was flexed and internally rotated exposing the proximal  femur which was entered with a box cutting chisel from the S-ROM set  followed by the initiating reamer followed by axial reaming up to a 10.5  axial reamer where we obtained chatter.  We then reamed up to 11.5 and  to a half of depth at 12.  This was followed by conical reaming to a 16D  cone to the appropriate depth to a 36-base neck and then calcar milling  to a 16D large calcar.  At this point, a 16D large trial cone was  hammered into place followed by a trial stem with a 36-base neck and  then NK +2 46-mm trial ball in the same version as the calcar which was  about 15 degrees.  At this point, the hip was reduced.  The leg did come  to a full extension with the knee bent, you could flex to 90 with 70 or  80 of internal rotation before the hip dislocated and on extension and  external rotation, the hip could not be dislocated.  At this point, the  trial components were removed and a 16D large ZTT-1 cone was hammered  into place followed by a 16 x 11 x 150 x 36 SROM stem in the same  version as the cone which seated nicely on the cone.  This was followed  by an NK +2 46-mm ultimate ball.  The hip was once again reduced.  Stability was noted to be excellent.  The capsular flap and short  external rotators were repaired back to the intertrochanteric crest  through drill holes using the #2 Ethibond suture.  The wound was  irrigated out with normal saline solution.  The IT band was closed with  running #1 Vicryl suture, the subcutaneous tissue with 0 and 2-0 undyed  Vicryl suture and the skin with skin staples.  A dressing of Xeroform  and Mepilex was then applied.  The patient  was unclamped, rolled supine,  awakened, and taken to the recovery room without difficulty.      Feliberto Gottron. Turner Daniels, M.D.  Electronically Signed     FJR/MEDQ  D:  02/11/2008  T:  02/12/2008  Job:  [id number]

## 2010-06-22 NOTE — Consult Note (Signed)
NAMECATRIONA, Baker               ACCOUNT NO.:  [id number]   MEDICAL RECORD NO.:  [id number]          PATIENT TYPE:  AMB   LOCATION:                                FACILITY:  [facility]   PHYSICIAN:  R. Roetta Sessions, M.D. DATE OF BIRTH:  [birth year]   DATE OF CONSULTATION:  07/11/2006  DATE OF DISCHARGE:                                 CONSULTATION   REASON FOR CONSULTATION:  Altered bowel habits, hematochezia, weight  loss.   HISTORY OF PRESENT ILLNESS:  [name] is a very pleasant 77-  year-old Caucasian female seen at the request of Dr. Yetta Numbers to  further evaluate a six-month history of going from one bowel movement  daily to one bowel movement every 3-4 days, having to strain and passing  some intermittent blood per rectum.  She also notes a 15-pound weight  loss over the past one year.  She tells me that her activity level,  exercise, walking, etc., has increased somewhat in the past three  months.  She has not had any odynophagia, dysphagia, early satiety,  reflux symptoms, nausea or vomiting.  She tells me her appetite is well  maintained.  She tells me Dr. Charna Elizabeth in 2000 or 2001 performed a  colonoscopy in [place] without significant findings.  There is no  family history of GI neoplasia.  There is no history of tobacco or  alcohol use.  Apparently she had a chest, abdomen, pelvic CT recently,  the results of which are unknown to me at this time.  I do have  accompanying labs which Dr. Regino Schultze had done on Jun 16, 2006.  Her CBC  was entirely normal.  Creatinine was up slightly at 1.27.  Her LFTs were  completely normal as was her serum calcium at 9.1 and TSH 2.817.   [name] has tried Metamucil before, but this plugged her up.  More  recently she has tried Dulcolax stool softeners, and this overshoots her  __________  sometimes she has nearly had accidents with diarrhea.   PAST MEDICAL HISTORY:  Significant for osteoarthritis, possibly  rheumatoid  arthritis, hypertension.   PAST SURGICAL HISTORY:  Hemorrhoidectomy, Dr. Wendall Mola in  [place] in 2001, ovaria cyst removed, back surgery, colonoscopy 2000  or 2001.   CURRENT MEDICATIONS:  1. Naproxen 500 mg daily.  2. Lisinopril 10 mg daily.  3. Vitamin E 400 international units daily.  4. Calcium 600 mg daily.  5. Mag-Ox 400 mg b.i.d.  6. __________ stool softener p.r.n.   ALLERGIES:  No known drug allergies.   FAMILY HISTORY:  Mother died at age 43, cause unknown.  Father died at  age 51, cause unknown.  One sister with osteoporosis.  Otherwise no  history of chronic GI or liver illness.   SOCIAL HISTORY:  Patient is married, has two sons.  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  No tobacco.  No  alcohol.  No illicit drugs.   REVIEW OF SYSTEMS:  No recent chest pain or dyspnea on exertion.  No  fever or  chills.  Some weight loss as outlined above.  GI symptoms as in  history of present illness.   PHYSICAL EXAMINATION:  GENERAL:  Pleasant 77 year old lady resting  comfortably.  VITAL SIGNS:  Weight 116, height 5 feet 5 inches, temperature 97.5,  blood pressure 122/64, pulse 84.  SKIN:  Warm and dry.  There is no jaundice or cutaneous stigmata of  chronic lung disease.  HEENT:  No scleral icterus.  JVD is not prominent.  Oral cavity with no  lesions.  CHEST:  Lungs are clear to auscultation.  CARDIAC:  Regular rate and rhythm without murmur, gallop or rub.  ABDOMEN:  Nondistended.  Positive bowel sounds.  Entirely soft and  nontender, without appreciable mass or organomegaly.  EXTREMITIES:  No edema.  RECTAL:  Moderately good sphincter tone.  No mass in rectal vault.  Somewhat firm stool high up in the rectum was palpable, brown in color,  hemoccult negative.   IMPRESSION:  [name] is a pleasant 77 year old lady with  change in bowel habits with some intermittent blood per rectum over the  past six months with  insidious 15-or-so-pound weight loss over the past  one year.   It has been the better part of a decade since she last had her lower GI  tract imaged.  Before making further recommendations in regards to a  bowel regimen, she needs to have her colon imaged with colonoscopy.  I  recommended diagnostic colonoscopy to [name].  We talked about the  potential risks, benefits and alternatives.  Her questions are answered.  She is agreeable.  Will plan to perform a diagnostic colonoscopy in the  very near future, then will make further recommendations.   I would like to thank Dr. Yetta Numbers for allowing me to see this nice  lady today.      Jonathon Bellows, M.D.  Electronically Signed     RMR/MEDQ  D:  07/11/2006  T:  07/11/2006  Job:  [id number]   cc:   Kirk Ruths, M.D.  Fax: [phone]

## 2010-06-22 NOTE — Assessment & Plan Note (Signed)
NAMEMarland Kitchen  ADER, FRITZE                CHART#:  [id number]   DATE:  01/11/2007                       DOB:  1930-11-05   Followup weight loss, dysphagia.  Last seen in the office 11/07/2006.  The patient had had an EGD for esophageal dysphagia in the setting of  weight loss, she was found to have a Schatzki's ring that was dilated.  She also had a colonoscopy in June 2008 which revealed friable  __________ sigmoid diverticula, otherwise was unrevealing.  She is not  having any dysphagia to solid food or liquids, she does have trouble  with large pills.  She has not had any abdominal pain, she has been  repeatedly Hemoccult negative.  However, in the setting of a great  appetite she has continued to lose weight.  She lost 20 pounds over 8  month period up to 11/07/2006.  She has lost another 7 pounds since  11/07/2006.  She is constipated, having only 1 bowel movement weekly,  MiraLax once daily did not help.  She is taking Dulcolax on occasion.   She had a CT of the abdomen and pelvis with both IV and oral contrast at  [facility] back in May which was reported to be normal.  I have  a copy of the CT on CD-ROM which I took over and reviewed with Dr.  Alver Fisher.  This lady was seen to have a small left hepatic-related lesion  and a 5-mm pancreatic tail lesion on the scan that was recommended an  MRI of the pancreas and liver be done.  However, she went to have the  study and her creatinine was noted to be elevated, presumably was normal  prior to her contrast CT back in May.  Creatinine was 1.23 earlier this  year, was 1.27 on 11/10/2006, 1.67 on 12/12/2006.  We obtained an  abdominal ultrasound which revealed no obvious pancreatic or liver  lesions, however the renal cortex was somewhat then suspicious for  chronic kidney disease.  We instructed her to stop her Naprosyn which  was taken previously, she tells me Dr. Regino Schultze told her to stop taking  her lisinopril as well.  Her __________   came back normal at 12, Chem-20  was okay except for glucose 115 and creatinine of 1.27 back in October.  Iron study is okay, sed rate 14, TSH 3.299.   CURRENT MEDICATIONS:  See updated list.   ALLERGIES:  No known drug allergies.   EXAMINATION:  Today she appears in no acute distress, accompanied by her  husband and weight 104, again down 7 pounds, height 5 foot 5,  temperature 97.3, blood pressure 108/68, pulse 76.  CHEST/LUNGS:  Clear to auscultation.  HEART:  Regular rate and rhythm without murmur, gallop or rub.  ABDOMEN:  Nondistended, positive bowel sounds, soft and nontender  without obvious mass or organomegaly.  EXTREMITIES:  Have no edema.   ASSESSMENT:  Dysphagia previously __________  Schatzki's ring.  She has  some difficulties with larger pills only at this time.  Weight loss is a  big issue and apparently new onset chronic kidney disease, presumably  she had normal creatinine prior to her contrast CT back in May.  Have to  wonder if her kidneys did not take a hit with IV contrast back in May.  She had subtle  lesions, not formerly described on the original report of  her liver lobe and pancreatic tail for which we have attempted further  workup without much success as outlined above.  I wonder is progressing  kidney disease is not contributing to some of her weight loss.  She is  chronically constipated.   RECOMMENDATIONS:  Increase MiraLax to 17 grams orally b.i.d. p.r.n.  bowel function, would like to shoot for 3 bowel movements a week or one  every other day.  Would like to go and revisit her liver and pancreas  with a non-IV contrast/oral contrast only CT and get an idea whether or  not she has a significant process in her liver and/or pancreatic tail.  I strongly urged she get with Dr. Regino Schultze to discuss referral to a  Nephrologist.  I will repeat a BMET today, further recommendations to  follow.       Jonathon Bellows, M.D.  Electronically Signed      RMR/MEDQ  D:  01/11/2007  T:  01/11/2007  Job:  [id number]   cc:   Kirk Ruths, M.D.

## 2010-06-22 NOTE — Op Note (Signed)
NAMEAERILYN, SLEE               ACCOUNT NO.:  [id number]   MEDICAL RECORD NO.:  [id number]          PATIENT TYPE:  AMB   LOCATION:  DAY                           FACILITY:  [facility]   PHYSICIAN:  R. Roetta Sessions, M.D. DATE OF BIRTH:  [birth year]   DATE OF PROCEDURE:  09/28/2006  DATE OF DISCHARGE:                               OPERATIVE REPORT   PROCEDURE:  Esophagogastroduodenoscopy with Elease Hashimoto dilation.   INDICATIONS FOR PROCEDURE:  A 77 year old lady with 23 pound weight loss  in the past eight months now with esophageal dysphagia to pills but not  solid food.  She also some mild anemia.  Hemoccults pending.  She had a  colonoscopy July 24, 2006 which revealed friable anal canal, sigmoid  diverticula, otherwise normal colon.   EGD is now being done.  This approach has discussed with the patient at  length.  Potential risks, benefits and alternatives have been reviewed,  questions answered, she is agreeable.  Please see documentation in the  medical record.   PROCEDURE NOTE:  O2 saturation, blood pressure, pulse, respirations  monitored throughout the entire procedure.   CONSCIOUS SEDATION:  Versed 2 mg IV, Demerol 50 mg IV in single dose.   INSTRUMENT:  Pentax video chip system.   FINDINGS:  Examination tubular esophagus revealed noncritical Schatzki's  ring.  Mucosa otherwise appeared normal.  I did not see anything  suspicious for a Zenker's, etc.  EG junction was easily traversed.  Stomach:  Gas cavity was empty,  insufflated well with air.  Thorough  examination of gastric mucosa including retroflex view of proximal  stomach, esophagogastric junction demonstrated moderate sized hiatal  hernia only.  The pylorus patent, easily traversed. Examination of bulb,  second portion revealed no abnormalities.   THERAPEUTIC/DIAGNOSTIC MANEUVERS PERFORMED:  Scope was withdrawn.  A 54-  French Maloney dilator was passed full insertion with ease.  Look back  revealed no apparent  complication related to passage of the dilator.  The patient tolerated the procedure well, was reacted in endoscopy.   IMPRESSION:  Noncritical Schatzki's ring, otherwise normal esophagus,  status post passage of 54-French Maloney dilator.  Small to moderate  size hiatal hernia, otherwise normal stomach, D1, D2.   RECOMMENDATIONS:  Will have Ms. Mun follow through on three  Hemoccults next week, will have her back in the office to see [place] in six  weeks, check CBC at that time.      Jonathon Bellows, M.D.  Electronically Signed     RMR/MEDQ  D:  09/28/2006  T:  09/29/2006  Job:  [id number]   cc:   Kirk Ruths, M.D.  Fax: [phone]

## 2010-06-22 NOTE — Assessment & Plan Note (Signed)
NAMEMarland Kitchen  [name], [name]                CHART#:  [id number]   DATE:  11/07/2006                       DOB:  20-Sep-1930   CHIEF COMPLAINT:  Follow up of weight loss and problems swallowing.   SUBJECTIVE:  The patient is here for follow up.  She was last seen for  an esophagogastroduodenoscopy on 09/28/2006.  She has a history of over  a 20-pound weight loss in the last 8 months, esophageal dysphagia, mild  anemia.  She had colonoscopy in June 2008, which revealed friable anal  canal, sigmoid diverticula but otherwise normal.  Esophagogastroduodenoscopy on 09/28/2006 revealed a non-critical  Schatzki ring status post 54-French dilator, small to moderate-sized  hiatal hernia.  She has also had chest, abdominal and pelvic CT to  further evaluate weight loss, which was unremarkable.  She recently had  her CBC rechecked and her hemoglobin is 11.1.  She had 3 hemoccult which  were negative.  Back in May she had a sedimentation rate that was  normal, TSH was normal, liver function tests were normal.  MET 7  revealed a creatinine of 1.27, which was slightly high and her glucose  was 119, which is high.   She presents back today stating that she just does not feel well.  She  states her appetite is good and she continues to eat well.  Her husband  agrees to this.  She states she has not been able to be as active as in  the past.  She does have some chronic back pain, which she has been  dealing with.  She has had prior surgery.  She just feels weak and does  not feel like herself.  She continues to have problems swallowing pills  stating that capsules hang up in her throat and sometimes she has to  spit them back out.  She has no problems swallowing liquids or food.  Her bowel movements occur every other day as long as she takes a stool  softener.  She denies any heartburn.  She complains of chronic sinus  drainage and wonders if this is why she has troubling swallowing her  pills.  Since I last  saw her she has dropped another pound.  She has  dropped a total of 22-23 pounds.  She denies any melena or rectal  bleeding.   CURRENT MEDICATIONS:  Lisinopril 10 mg daily, vitamin E 400  international units daily, calcium 600 mg and vitamin D daily, Magnesium  oxide 400 mg two times a day, stool softener as needed, Darvocet two  times a day, aspirin 81 mg daily, multivitamin daily, Tylenol Arthritis  two times a day.   ALLERGIES:  NO KNOWN DRUG ALLERGIES.   PHYSICAL EXAMINATION:  VITAL SIGNS:  Weight 111 pounds, height 5' 5,  temperature 97.7, blood pressure 110/68, pulse 80.  GENERAL:  This is a pleasant, thin Caucasian female in no acute  distress.  SKIN:  Warm and dry.  No jaundice.  HEENT:  Sclerae are nonicteric.  Oropharyngeal mucosa is pink and moist.  No lesions, erythema or exudate.  No lymphadenopathy or thyromegaly.  CHEST:  Clear to auscultation.  CARDIAC:  Regular rate and rhythm.  No murmurs, rubs or gallops.  ABDOMEN:  Positive bowel sounds.  Abdomen is soft, nontender,  nondistended.  No organomegaly or masses.  EXTREMITIES:  No edema.   IMPRESSION:  The patient is a pleasant 77 year old lady who has  progressive weight loss, mild anemia.  She continues to complain of pill  dysphagia with only a noncritical Schatzki ring seen at her last EGD.  She complains of a lot of sinus congestion and posterior nasal drip.  She has had this chronically.  She states her appetite is fine and  states she eats very well.  Her continued weight loss is very worrisome.  She has had an unremarkable workup thus far.  Her only other physical  complaint is that of chronic back pain, which she really denies any  worsening lately.  It tends to be worse with certain activities.  She  had a lumbar spine film done 09/15/2006, which revealed prior L4-L5  posterior fusion and mild scattered facet and disc degenerative changes  with osteoporosis and minimal scoliosis.   I have discussed the  following case with Dr. Jena Gauss.  It has been over 4  months since she had blood work analysis.  We will go ahead and recheck  a chem-20, sedimentation rate and TSH.  I will treat the patient's sinus  congestion to see if this helps with any of her pill dysphagia.  If not,  I have recommended a barium pill esophagram and if normal a referral to  an ENT specialist.   PLAN:  1. Clarinex D 1 every 12 hours as needed, #30, 0 refills.  2. Chem-20, sedimentation rate and TSH.  3. We will obtain a CD of chest, abdominal and pelvic CT films done at      [facility] in May of this year for further review.  4. Further recommendations to follow.       Tana Coast, P.A.  Electronically Signed     R. Roetta Sessions, M.D.  Electronically Signed    LL/MEDQ  D:  11/07/2006  T:  11/07/2006  Job:  [id number]   cc:   Kirk Ruths, M.D.

## 2010-06-22 NOTE — Op Note (Signed)
NAMEKRISTYNE, WOODRING               ACCOUNT NO.:  [id number]   MEDICAL RECORD NO.:  [id number]          PATIENT TYPE:  AMB   LOCATION:  DAY                           FACILITY:  [facility]   PHYSICIAN:  R. Roetta Sessions, M.D. DATE OF BIRTH:  12-Mar-1930   DATE OF PROCEDURE:  07/24/2006  DATE OF DISCHARGE:                               OPERATIVE REPORT   INDICATIONS FOR PROCEDURE:  77 year old lady with intermittent low-  volume hematochezia, had altered bowel movements going from one bowel  movement to once every 3-4-days, sometimes drains.  She had a CT  abdomen, chest and pelvis recently without any significant findings.  She has had a 15 pound weight loss over the past 1 year.  Colonoscopy is  now being done.  This approach has been discussed with the patient at  length including the potential risks, benefits, alternatives  reviewed,  questions answered and agreeable.  Please see documentation in the  medical record.   O2 saturation, blood pressure, pulse and respirations were monitored  throughout the entire procedure.   CONSCIOUS SEDATION:  Versed 3 mg IV, Demerol 75 mg IV in divided doses.   INSTRUMENT USED:  Pentax video chip system.   FINDINGS:  Digital rectal exam revealed no abnormalities.  Endoscopic  findings revealed the prep was satisfactory.  There was an oily  substance in the colon which was very difficult to wash away and it made  the image intermittently somewhat blurry which was a challenge.  However,  the colonic mucosa was surveyed from the rectosigmoid junction  to the left transverse right colon to the appendiceal orifice, ileocecal  valve and cecum.  These structures well seen and photographed for the  record.  From this level the scope was slowly withdrawn.  All previously  mentioned mucosal surfaces were again seen.  The patient was noted to  have some scattered sigmoid diverticula.  Remainder colonic mucosa  appeared normal.  Scope was pulled down to the rectum  where there was a  thorough exam of the rectal mucosa.  Retroflexed view of the anal verge  on posterior view of the anal canal demonstrated only friable anal canal  tissue.  The patient tolerated the procedure well.   IMPRESSION:  1. Friable anal canal, otherwise normal rectum.  2. Sigmoid diverticula, the colonic mucosa appeared normal.   RECOMMENDATIONS:  1. Diverticulosis/hemorrhoid literature provided to [name].  2. Daily Metamucil or Citrucel fiber supplement.  3. MiraLax 17 grams orally at bedtime p.r.n. constipation.  4. CBC today.  5. Anusol-HC suppositories one per rectum at bedtime.  6. Follow-up appointment with [place] in 2 months so that we can assess her      progress.      Jonathon Bellows, M.D.  Electronically Signed     RMR/MEDQ  D:  07/24/2006  T:  07/24/2006  Job:  [id number]   cc:   Karleen Hampshire, M.D.

## 2010-06-22 NOTE — H&P (Signed)
NAMETSION, INGHRAM               ACCOUNT NO.:  [id number]   MEDICAL RECORD NO.:  [id number]          PATIENT TYPE:  AMB   LOCATION:  DAY                           FACILITY:  [facility]   PHYSICIAN:  Kassie Mends, M.D.      DATE OF BIRTH:  Sep 20, 1930   DATE OF ADMISSION:  DATE OF DISCHARGE:  LH                              HISTORY & PHYSICAL   CHIEF COMPLAINT:  Anemia/pill dysphagia/weight loss.   HISTORY OF PRESENT ILLNESS:  Ms. Lei is a 77 year old female.  She  was sent for evaluation by Dr. Jena Gauss of a change in bowel habits with  intermittent blood per rectum as well as weight loss.  She tells me as  of 8 months ago she has lost 23 pounds.  She had a colonoscopy by Dr.  Jena Gauss on July 24, 2006.  She was found to have friable anal canal,  sigmoid diverticula in an otherwise normal colon.  She was also found to  be anemic with a hemoglobin of 11.2, hematocrit 31.5 and MCV of 89.5 on  July 24, 2006.  She tells me over the last week she has noticed  dysphagia with pills.  She feels as though her pills gets stuck in her  upper esophagus or back of her throat.  She denies any heartburn  indigestion.  She denies any problems with solids or liquids.  She  denies any nausea or vomiting.  She is taking aspirin 81 mg daily.  Denies any rectal bleeding or melena.  She tells me she is having normal  bowel movements anywhere between one every day to every 3 days.  She  tells me she has not changed her diet.  She continues to eat three meals  a day.  She denies any anorexia or early satiety.  She does have history  of arthritis but denies any NSAID use.  She is taking either Tylenol or  Darvocet for her pain.   She has had a normal TSH on Jun 16, 2006.   PAST MEDICAL HISTORY:  1. Colonoscopy as described in HPI.  2. History of knobby osteoarthritis.  3. Hypertension.  4. Hemorrhoidectomy by Dr. Abbey Chatters in 2001.  5. Ovarian cystectomy.  6. Back surgery.  7. Anemia.  8. Renal insufficiency  with creatinine of 1.27.   CURRENT MEDICATIONS:  1. Lisinopril 10 mg daily.  2. Vitamin E 400 IU units daily.  3. Calcium 600 mg.  4. Vitamin D daily.  5. Mag-Ox 400 mg b.i.d.  6. Stool softener p.r.n.  7. Propoxyphene/APAP N-100 b.i.d.  8. Aspirin 81 mg daily.   ALLERGIES:  NO KNOWN DRUG ALLERGIES.   FAMILY HISTORY:  Mother deceased at age 10, unknown etiology.  Father  deceased at 31, etiology unknown.  No known family history of colorectal  carcinoma or liver or chronic GI problems.   SOCIAL HISTORY:  Ms. Fryberger is married.  She has two healthy children.  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  Denies any tobacco, alcohol or drug use.   REVIEW OF SYSTEMS:  See HPI, otherwise negative.  PHYSICAL EXAMINATION:  VITAL SIGNS: Weight 112 pounds, height 63 inches,  temperature 97.9, blood pressure 118/70, pulse 72.  GENERAL: Ms. Bujak is an elderly Caucasian female who is alert,  oriented, pleasant and cooperative.  She does appear pale.  HEENT:  Sclerae clear, nonicteric.  Conjunctivae pale.  Oropharynx pink  and moist without any lesions.  NECK: Supple without any mas or thyromegaly.  CHEST:  Heart regular rate and rhythm.  Normal S1-S2 without murmurs,  clicks, rubs or gallops.  LUNGS: Clear to auscultation bilaterally.  ABDOMEN:  Positive bowel sounds x4.  No bruits auscultated.  Soft,  nontender, nondistended.  No palpable mass or hepatosplenomegaly, no  rashes or guarding.  EXTREMITIES:  Without clubbing or edema bilaterally.  SKIN:  Pale, warm and dry without any rash or jaundice.   IMPRESSION:  Ms. Ciocca is a 77 year old female with normocytic anemia  as well as a 23-pound weight loss in the last 8 months.  Recent  colonoscopy was benign although her internal hemorrhoids may be the  culprit for her rectal bleeding.  However, I do not suspect this would  have caused such a significant drop in her hemoglobin.  She has had no  further  overt GI bleeding since colonoscopy, and therefore, will need to  recheck her hemoglobin.  Her weight loss is very concerning and this  coupled with the fact that she is having new-onset pill dysphagia.  She  is going to require further evaluation of her upper GI tract to rule out  esophageal web ring stricture or occult malignancy as well as peptic  ulcer disease.  I have offered either upper GI series or EGD for further  evaluation.   PLAN:  EGD with Dr. Jena Gauss in the near future.  I discussed the procedure  including risks and benefits, which include but are not limited to  bleeding, infection, perforation, drug reaction.  She agrees with plan  and consent will be obtained.  She is to hold her iron for 3 days prior  to the procedure.  She is going to need a CBC today Hemoccult stools x3.      Lorenza Burton, N.P.      Kassie Mends, M.D.  Electronically Signed   KJ/MEDQ  D:  09/19/2006  T:  09/20/2006  Job:  [id number]   cc:   Kirk Ruths, M.D.  Fax: [phone]

## 2010-06-25 NOTE — Op Note (Signed)
[place]  Patient:    [name], [name]                      MRN: [id number] Proc. Date: 04/08/99 Adm. Date:  [id number] Attending:  Arlis Porta CC:         Anselmo Rod, M.D.                           Operative Report  PREOPERATIVE DIAGNOSIS:  Internal and external hemorrhoids.  POSTOPERATIVE DIAGNOSIS:  Internal and external hemorrhoids.  OPERATION PERFORMED:  Complete hemorrhoidectomy.  SURGEON:  Adolph Pollack, M.D.  ASSISTANT:  None.  ANESTHESIA:  General.  INDICATIONS:  Ms. Alguire is a 77 year old female who has been having problems with hemorrhoids for many, many years.  Now she is having a persistent problem with bleeding from the hemorrhoids.  Colonoscopy has not shown any other suspicious lesions.  She now is here for hemorrhoidectomy.  The procedure and the risks have been explained to her extensively including but not limited to bleeding, infection, anal incontinence and anal stenosis.  She seemed to understand this and agreed o proceed.  She is placed supine on the operating table and a general anesthetic was administered.  She was then placed in the lithotomy position.  The perianal area and perineal area were sterilely prepped and draped.  On examination, there was an irregularity and a very large left lateral hemorrhoid. Using the cautery, I scored the areas of excision and injected 0.5% plain Marcaine directly submucosally. I used a 3-0 chromic suture to ligate the hemorrhoidal pedicle, then excised it sharply.  I then reapproximated the mucosa and skin with a running locking 3-0 chromic suture.  Next, I approached the right anterolateral hemorrhoid, marked the area of excision with the electrocautery and injected Marcaine in the submucosal area.  I then excised this hemorrhoid sharply and then closed the mucosa and skin with a running locking 3-0 chromic suture.  Finally, I approached a  smaller hemorrhoid in the right posterolateral area. The area of excision was marked with the cautery, local anesthetic was infiltrated nd then the hemorrhoid was excised sharply.  The mucosa and skin were reapproximated with a running locking 3-0 chromic suture.  Of note, all hemorrhoids were internal and external and the internal and external components were removed.  Following this, I did an anal block with the remaining 0.5 Marcaine with epinephrine.  She also felt a little tight, so I performed a closed lateral sphincterotomy and then gently dilated the anus.  As hemostasis as adequate this time, I put a piece of Gelfoam in the anus and followed it with a  bulky dressing.  She tolerated the procedure well without any apparent complications.  She was taken to the recovery room in satisfactory condition. DD:  04/08/99 TD:  04/08/99 Job: [id number]

## 2010-06-25 NOTE — Discharge Summary (Signed)
   NAMEMarland Kitchen  [name], [name]                         ACCOUNT NO.:  [id number]   MEDICAL RECORD NO.:  [id number]                   PATIENT TYPE:  INP   LOCATION:  3014                                 FACILITY:  [facility]   PHYSICIAN:  Stefani Dama, M.D.               DATE OF BIRTH:  1930/06/06   DATE OF ADMISSION:  06/24/2002  DATE OF DISCHARGE:  06/27/2002                                 DISCHARGE SUMMARY   ADMISSION DIAGNOSES:  1. Lumbar spondylosis and stenosis, L2-3, L3-4.  2. Spondylolisthesis with stenosis and lumbar radiculopathy, L4-5.   DISCHARGE AND FINAL DIAGNOSES:  1. Lumbar spondylosis and stenosis, L2-3, L3-4.  2. Spondylolisthesis with stenosis and lumbar radiculopathy, L4-5.   MAJOR OPERATION:  1. Lumbar epidural procedure, L4.  2. Laminotomies, L3-4 and L2-3 bilaterally.  3. Posterolateral arthrodesis with posterior lumbar interbody fusion bone.  4. Posterolateral arthrodesis with local autograft and allograft.  5. Pedicle screw fixation, L4-5.  6. Interbody arthrodesis with posterior lumbar interbody fusion bone spacer     on Jun 24, 2002.   CONDITION ON DISCHARGE:  Improving.   HOSPITAL COURSE:  The patient is a 77 year old individual who has had  significant back and bilateral leg pain with progressive weakness over the  past number of months.  She was evaluated with a myelogram, which  demonstrated critical stenosis at the level of L4-5, high-grade stenosis at  L2-3 and L3-4.  After careful consideration of her options, she was advised  regarding surgical decompression and stabilization of the lumbar spine; this  was carried out on Jun 24, 2002.   Postoperatively, the patient was mobilized on the first postoperative day  and had a considerable amount of pain, but gradually, this appeared to  improve.  By the time of discharge, she is ambulating independently; she is  using a walker.  Her incision is clean and dry.  She has been afebrile.  She  has been given  two days of postoperative antibiotics, and this is being  discontinued at the current time.  Her pain management is good with Percocet  one or two tablets every six hours as needed for pain and is given a  prescription for this medication, number 60 without refills, in addition to  Valium 5 mg, number 30 without refills, for muscle spasm.  She will be seen  in the office in three weeks' time for further followup.                                               Stefani Dama, M.D.    [name]  D:  06/27/2002  T:  06/28/2002  Job:  [id number]

## 2010-06-25 NOTE — Op Note (Signed)
NAMEMarland Kitchen  [name], [name]                         ACCOUNT NO.:  [id number]   MEDICAL RECORD NO.:  [id number]                   PATIENT TYPE:  INP   LOCATION:  3014                                 FACILITY:  [facility]   PHYSICIAN:  Stefani Dama, M.D.               DATE OF BIRTH:  03/27/1930   DATE OF PROCEDURE:  06/24/2002  DATE OF DISCHARGE:                                 OPERATIVE REPORT   PREOPERATIVE DIAGNOSES:  1. Lumbar spinal stenosis, L2-3, L3-4, L4-5.  2. Spondylolisthesis, L4-5, with lumbar radiculopathy.   POSTOPERATIVE DIAGNOSES:  1. Lumbar spinal stenosis, L2-3, L3-4, L4-5.  2. Spondylolisthesis, L4-5, with lumbar radiculopathy.   PROCEDURES:  1. Lumbar Gill procedure, L4.  2. Laminotomies at L3-4 and L2-3.  3. Posterolateral arthrodesis with PLIF bone and posterolateral arthrodesis     with local autograft and allograft.  4. Pedicle screw fixation L4, L5.   SURGEON:  Stefani Dama, M.D.   ASSISTANT:  Hilda Lias, M.D.   ANESTHESIA:  General endotracheal.   INDICATIONS:  The patient is a 77 year old individual who has had  significant back and bilateral leg pain and progressive weakness.  She has  multiple levels of stenosis at L2-3 and L3-4 and severe stenosis that is  critical at the L4-5 level, where there is spondylolisthesis.  The patient  was advised regarding surgical decompression.  She was taken to the  operating room for that procedure.   DESCRIPTION OF PROCEDURE:  The patient was brought to the operating room  supine on the stretcher.  After the smooth induction of general endotracheal  anesthesia, she was turned prone, the back was shaved, prepped with  Duraprep, and draped in a sterile fashion.  A midline incision was created  and carried down to the lumbar dorsal fascia and on two separate  radiographs, the spinous process of 75 and L5 were identified.  The  interlaminar spaces were then dissected free in a subperiosteal fashion.  At  L4-5 a  Gill procedure was then carried out, removing the laminar arch of L4  and both facet joints fully to expose the L4 nerve root superiorly, the L5  nerve root inferiorly, and decompress these.  Hemostasis from epidural veins  was obtained with the bipolar cautery.  With the decompression being  completed, laminotomies were then created a L3-4 and L4-5.  This was done  with a high-speed air drill and a 3 mm dissecting tool to remove the mesial  wall of the facet at L2-3 and the mesial wall of the facet at L3-4  bilaterally.  The redundant and thickened yellow ligament was then taken up  with a 2 and a 3 mm Kerrison punch, and care was taken to decompress  superiorly and inferiorly and out the lateral recesses on both sides so as  to decompress the L2 nerve roots and the most superior aspect of the L3 and  the L4 nerve roots.  Once this was achieved, a diskectomy was then performed  at the L4-5 space.  This was a complete diskectomy, removing all the  vestiges of disk out to the outer layer of the annular fibers.  Once the  diskectomy was completed, a series of curettes, rongeurs, and rasps was used  to decorticate the end plates on either side.  Then using sizing instrument,  an 11 mm PLIF bone spacer could be placed into the interspace.  Prior to  placing the bone spacer, local autograft was harvested from the laminectomy  and the Gill procedure, was cut into small pieces of bone and mixed with  some of the shavings obtained from the high-speed air drill, and this was  packed into the interspace.  The bone was packed ventrally and laterally and  the bone spacers were placed, and the rest of the bone was packed into the  interspace all the way around the bone spacers.  Once this was accomplished,  pedicle entry sites were chosen and probes were placed into each of the four  pedicles involved, two at L4 and two at L5.  The intertransverse spaces were  then decorticated.  The pedicle entry sites  were checked radiographically  and found to be in good position, and each of the pedicle entry sites were  sequentially tapped and  threaded with 6.2 x 40 mm screws in each of the  holes.  The intertransverse spaces were then packed with the patient's own  autograft over Vitoss wafer that was cut to fit in the intertransverse space  at L4-5.  With this, then the screws were placed, rods were placed between  the screws, the screws were tightened down, and some compression was placed  so as to restore a more normal lordosis at the L4-5 space.  Once this was  accomplished, care was taken to make sure that the L4 and the L5 nerve roots  were and remained well-decompressed.  The remainder of bone was layered out  to the intertransverse spaces along with the Vitoss wafer that was placed  over the space.  The area was then carefully checked for hemostasis.  No  spinal fluid leaks were encountered during this decompressive procedure, and  then with a final localizing radiograph being secured, the lumbar dorsal  fascia was closed with #1 Vicryl in interrupted fashion, 2-0 Vicryl was used  in the subcutaneous and subcuticular tissue, 3-0 Vicryl was used  subcuticularly.  The patient tolerated the procedure well and was returned  to the recovery room in stable condition.                                               Stefani Dama, M.D.    [name]  D:  06/24/2002  T:  06/25/2002  Job:  [id number]

## 2010-06-25 NOTE — H&P (Signed)
NAME:  [name], [name]                         ACCOUNT NO.:  [id number]   MEDICAL RECORD NO.:  [id number]                   PATIENT TYPE:  INP   LOCATION:  NA                                   FACILITY:  [facility]   PHYSICIAN:  Stefani Dama, M.D.               DATE OF BIRTH:  October 11, 1930   DATE OF ADMISSION:  06/24/2002  DATE OF DISCHARGE:                                HISTORY & PHYSICAL   ADMISSION DIAGNOSES:  1. Spondylolisthesis, L4-L5.  2. Spondylosis and stenosis L2-L3, L3-L4, and L4-L5.  3. Lumbar radiculopathy.   HISTORY OF PRESENT ILLNESS:  [name] is a 77 year old individual who I  initially evaluated in September of last year.  She was found to have  significant spondylosis in the lumbar spine.  At that time I advised her of  a conservative management, and a trial of epidural steroid injections in  addition to some exercise programs.  She notes that for a while she seemed  to be stable, but over the past few months she has been continually  deteriorating.  She now notes that it is hard for her even to get around in  her house.  She has been concerned that her legs feel increasingly weak.  After reevaluation, I performed a myelogram a few weeks ago, which  demonstrated that the patient has a degenerative spondylolisthesis at the L4-  L5 level with a complete block myelographically, high grade stenosis at L2-  L3 and at L3-L4 is also noted.  She was advised regarding surgical  decompression and stabilization and is now admitted for this procedure.   PAST MEDICAL HISTORY:  Reveals that the patient's general health has been  very good.  She notes no significant medical problems such as diabetes, high  blood pressure, or liver, heart, or kidney disease.   ALLERGIES:  SHE NOTES NO ALLERGIES TO ANY PAIN MEDICATIONS; OR ANY OTHER  MEDICATIONS.   CURRENT MEDICATIONS:  Include:  Allegra D, Astelin, Bextra, oxycodone,  OsteoBioFlex, calcium, and vitamin D.   PAST SURGICAL  HISTORY:  She had a hemorrhoidectomy in 2001.  Uterine polyp  removed in 2002.  Fatty tumor removed from her left lower back in 2001.   FAMILY HISTORY:  Notable for arthritis, type 2 diabetes, cancer, and  Alzheimer's disease.   SOCIAL HISTORY:  She does not smoke.  She does not drink alcohol.  Her  height and weight have been stable at 5' 5 and 134 lb.  She is retired.   SYSTEM REVIEW:  Notable for night sweats, wearing of glasses, ringing in the  ears, inability to smell, sinus problems, leg pain while walking, leg pain  and arthritis noted on the 14-point review sheet.   PHYSICAL EXAMINATION:  Reveals that she is an alert, oriented, and  cooperative individual in no overt distress.  She will stand straight and  erect with some difficulty.  She tends  to favor a 5-degree forward stoop.  Her mobility is good in the lumbar spine.  She is able to flex, touching her  fingertips to her toes and notes that this gives her some relief.  She had  previously been noted to be able to extend normally, but she now barely  extends to the neutral position.  Her motor strength is good in the  iliopsoas, the quad, the tibialis anterior, and the gastrocnemius.  No  atrophy is noted.  Deep tendon reflexes are 2+ the patellae, trace in the  Achilles.  Babinskis are downgoing.  Sensation is intact to pain and  vibration distally in the lower extremities.  Straight leg raising is  negative to 80 degrees bilaterally.  Patrick's maneuver is negative  bilaterally also.  Upper extremity strength and reflexes are all within the  limits of normal, in the biceps and triceps particularly.  Cranial nerve  examination reveals that the pupils are 4 mm, brisk reactive to light and  accommodation.  The extraocular movements are full.  The face is symmetric  to grimace.  Tongue and uvula protrude in the midline.  Sclerae and  conjunctivae are clear.  Facial sensation is intact in all 3 dermatomes.  The neck reveals no  masses and no bruits are heard.  The lungs are clear to  auscultation.  The heart has a regular rate and rhythm, no murmurs are  heard.  The abdomen is soft, bowel sounds are positive, no masses are  palpable.  Extremities reveal no clubbing, cyanosis, or edema.   IMPRESSION:  The patient has evidence of spondylitic stenosis in the lumbar  spine degenerative spondylolisthesis at the L4-L5 level.  She has been  advised regarding surgical decompression of the 3 severest levels involved,  L2-L3, L3-L4, and L4-L5.  She has been also been advised that she will  require arthrodesis at the L4-L5 level.  She is now admitted for procedure.                                                Stefani Dama, M.D.    [name]  D:  06/24/2002  T:  06/24/2002  Job:  [id number]

## 2010-06-25 NOTE — Procedures (Signed)
[place]  Patient:    [name]                       MRN: [id number] Proc. Date: 01/06/99 Adm. Date:  [id number] Attending:  Charna Elizabeth                           Procedure Report  DATE OF BIRTH:  May 03, 1930  REFERRING PHYSICIAN:  Dr. Lanier Clam.  PROCEDURE PERFORMED:  Flexible sigmoidoscopy.  ENDOSCOPIST:  Anselmo Rod, M.D.  INSTRUMENT USED:  Olympus video colonoscope.  INDICATIONS:  Erythematous patch in the rectum seen a year ago on colonoscopy in a 77 year old white female with a family history of colon cancer, so a "Relook" is planned at this area.  PREPROCEDURE PREPARATION:  Informed consent was procured from the patient.  The  patient was fasted for 8 hours prior to the procedure and prepped with two Fleets enemas the morning of the procedure.  PREPROCEDURE PHYSICAL:  Patient has stable vital signs.  NECK: Supple.  CHEST:  Clear to auscultation. S1, S2 regular.  ABDOMEN:  Soft with normal abdominal bowel sounds.  DESCRIPTION OF PROCEDURE:  The patient was placed in left lateral decubitus position, no sedation was used.  Once the patient was adequately positioned, the Olympus video colonoscope was advanced from the rectum to 1 m without difficulty. No masses, polyps, erosions, ulcerations, etc. were seen.  The patient had prominent external and internal hemorrhoids.  Internal hemorrhoids seemed to be  prolapsing externally.  No erythematous patch was seen in the rectum.  The patient tolerated the procedure well without complication.  Internal hemorrhoids were also seen on retroflexion.  IMPRESSION:  Essentially unrevealing flexible sigmoidoscopy up to 1 m, except for prominent external and internal hemorrhoids.  No erythematous patch seen in the  rectum.  RECOMMENDATIONS: 1. Patient has been advised to undergo subsequent evaluation for a possible    hemorrhoidectomy.  This will be rediscussed  with her at her office, which is  scheduled for January 23, 1999. 2. Considering her family history of colon, repeat colonoscopy is recommended in    the next three years. DD:  01/06/99 TD:  01/06/99 Job: [id number]

## 2010-06-25 NOTE — Discharge Summary (Signed)
[name], RIDEN NO.:  [id number]   MEDICAL RECORD NO.:  [id number]          PATIENT TYPE:  INP   LOCATION:  5024                         FACILITY:  [facility]   PHYSICIAN:  Feliberto Gottron. Turner Daniels, M.D.   DATE OF BIRTH:  09-29-1930   DATE OF ADMISSION:  02/11/2008  DATE OF DISCHARGE:  02/17/2008                               DISCHARGE SUMMARY   CHIEF COMPLAINT:  Right hip pain.   HISTORY OF PRESENT ILLNESS:  This is a 77 year old lady who complains of  unremitting pain in her right hip despite conservative treatment.  She  desires a surgical intervention at this time.  All risks and benefits of  surgery were discussed with the patient.   PAST MEDICAL HISTORY:  Significant for polyps and hemorrhoids   PAST SURGICAL HISTORY:  She had a lumbar fusion in 2004.   SOCIAL HISTORY:  She is a nonsmoker and denies alcohol use.   FAMILY HISTORY:  Noncontributory.   ALLERGY:  She has an allergy to HYDROCODONE.   CURRENT MEDICATIONS:  1. Aspirin 81 mg one p.o. daily.  2. Hydrocodone 5/500 mg one p.o. q.4 h. p.r.n. pain.  3. MiraLax p.r.n.   PHYSICAL EXAMINATION:  Gross examination of the right hip demonstrates  the patient to have a limited internal rotation and tenderness with  external rotation.  She has a negative foot tap.  She is neurovascularly  intact.   X-rays demonstrate end-stage degenerative joint disease of the right hip  with protrusio and acetabular cyst.   LABORATORY DATA:  White blood cells 6.4, red blood cells 4.19,  hemoglobin 13, hematocrit 38.9, platelets 177.  PT 13.6, INR 1.0, PTT  27.  Sodium 141, potassium 3.7, chloride 103, glucose 89, BUN 16,  creatinine 1.03.  Urinalysis demonstrates moderate leukocyte esterase  with many bacteria.   HOSPITAL COURSE:  [name] was admitted to [facility] on February 11, 2008 when she underwent right total hip arthroplasty.  The procedure was  performed by Dr. Gean Birchwood and the patient tolerated it  well.  Perioperative Foley catheter was placed and she was transferred to the  orthopedic floor.  She was placed on Lovenox and Coumadin for DVT  prophylaxis.  On the first postoperative day, the patient was awake and  alert.  Hemoglobin was 8.6.  X-rays demonstrated a well-placed well-  fixed prosthesis without evidence of loosening.  On the second  postoperative day, the patient was alert but complaining of pain in her  right hip.  Hemoglobin was 6.4 so she was transfused with 2 units of  packed red blood cells.  Foley catheter was removed.  The patient was  not able to work with physical therapy because of fatigue secondary to  anemia.  On the third postoperative day, the patient was complaining of  minimal pain in her hip.  Surgical dressing was clean.  Hemoglobin was  8.6.  She ambulated 80 feet with physical therapy.  On the fourth  postoperative day, the patient was awake but not oriented to time or  place .  She was having episodes of  confusion and hallucinations.  Urine  cultures were positive for Proteus and the patient was placed on  antibiotics.  Internal Medicine was consulted to evaluate confusion.  Her confusion was felt to be secondary to urinary tract infection and  narcotic effect.  Her pain medicines were discontinued and she was  placed on Tylenol p.r.n. for pain.  Her antibiotic was also changed.  She continued to progress slowly with physical therapy and her confusion  improved over the next few days.  She was discharged on postoperative  day #6.  At that point, she was eating well and ambulating independently  and her hip pain was well controlled with oral medications.   DISPOSITION:  The patient was discharged home on February 17, 2008.  She  was weightbearing as tolerated and would return to the clinic in 1 week  to see Dr. Turner Daniels.  Home health managed her wound, Coumadin and physical  therapy.   DISCHARGE MEDICINES:  As per the HMR with the addition of Vicodin  5/500  mg tablets 1-2 tablets p.o. q.4 h. p.r.n. pain and Coumadin take as  directed with a target INR of 1.52.   FINAL DIAGNOSIS:  End-stage degenerative joint disease of the right hip  with secondary diagnosis of acute blood loss anemia.      Michele Harris, PA      Feliberto Gottron. Turner Daniels, M.D.  Electronically Signed    JW/MEDQ  D:  03/31/2008  T:  03/31/2008  Job:  [id number]

## 2010-07-13 ENCOUNTER — Ambulatory Visit: Payer: Medicare Other | Admitting: Urology

## 2010-07-18 ENCOUNTER — Inpatient Hospital Stay (HOSPITAL_COMMUNITY)
Admission: EM | Admit: 2010-07-18 | Discharge: 2010-07-22 | DRG: 202 | Disposition: A | Discharge status: Nursing Home (Includes ICF) | Payer: Medicare Other | Attending: Internal Medicine | Admitting: Internal Medicine

## 2010-07-18 ENCOUNTER — Emergency Department (HOSPITAL_COMMUNITY): Payer: Medicare Other

## 2010-07-18 DIAGNOSIS — B964 Proteus (mirabilis) (morganii) as the cause of diseases classified elsewhere: Secondary | ICD-10-CM | POA: Diagnosis present

## 2010-07-18 DIAGNOSIS — I129 Hypertensive chronic kidney disease with stage 1 through stage 4 chronic kidney disease, or unspecified chronic kidney disease: Secondary | ICD-10-CM | POA: Diagnosis present

## 2010-07-18 DIAGNOSIS — E860 Dehydration: Secondary | ICD-10-CM | POA: Diagnosis present

## 2010-07-18 DIAGNOSIS — J209 Acute bronchitis, unspecified: Principal | ICD-10-CM | POA: Diagnosis present

## 2010-07-18 DIAGNOSIS — N183 Chronic kidney disease, stage 3 unspecified: Secondary | ICD-10-CM | POA: Diagnosis present

## 2010-07-18 DIAGNOSIS — K5900 Constipation, unspecified: Secondary | ICD-10-CM | POA: Diagnosis present

## 2010-07-18 DIAGNOSIS — Z66 Do not resuscitate: Secondary | ICD-10-CM | POA: Diagnosis present

## 2010-07-18 DIAGNOSIS — N390 Urinary tract infection, site not specified: Secondary | ICD-10-CM | POA: Diagnosis present

## 2010-07-18 DIAGNOSIS — F0390 Unspecified dementia without behavioral disturbance: Secondary | ICD-10-CM | POA: Diagnosis present

## 2010-07-18 LAB — CBC
HCT: 38.7 % (ref 36.0–46.0)
MCHC: 33.3 g/dL (ref 30.0–36.0)
MCV: 92.1 fL (ref 78.0–100.0)
Platelets: 160 10*3/uL (ref 150–400)
RDW: 13.9 % (ref 11.5–15.5)
WBC: 12.2 10*3/uL — ABNORMAL HIGH (ref 4.0–10.5)

## 2010-07-18 LAB — BASIC METABOLIC PANEL
BUN: 26 mg/dL — ABNORMAL HIGH (ref 6–23)
Chloride: 96 mEq/L (ref 96–112)
GFR calc Af Amer: >60 mL/min (ref >60)
GFR calc non Af Amer: 57 mL/min — ABNORMAL LOW (ref >60)
Potassium: 4.2 mEq/L (ref 3.5–5.1)
Sodium: 132 mEq/L — ABNORMAL LOW (ref 135–145)

## 2010-07-18 LAB — URINALYSIS, ROUTINE W REFLEX MICROSCOPIC
Nitrite: POSITIVE — AB
Protein, ur: NEGATIVE mg/dL
Specific Gravity, Urine: 1.015 (ref 1.005–1.030)
Urobilinogen, UA: 0.2 mg/dL (ref 0.0–1.0)

## 2010-07-18 LAB — DIFFERENTIAL
Eosinophils Absolute: 0 10*3/uL (ref 0.0–0.7)
Lymphs Abs: 2 10*3/uL (ref 0.7–4.0)
Monocytes Absolute: 0.9 10*3/uL (ref 0.1–1.0)
Neutrophils Relative %: 77 % (ref 43–77)
WBC Morphology: INCREASED

## 2010-07-18 LAB — URINE MICROSCOPIC-ADD ON

## 2010-07-19 LAB — COMPREHENSIVE METABOLIC PANEL
Albumin: 3.2 g/dL — ABNORMAL LOW (ref 3.5–5.2)
BUN: 17 mg/dL (ref 6–23)
CO2: 26 mEq/L (ref 19–32)
Calcium: 8.9 mg/dL (ref 8.4–10.5)
Chloride: 99 mEq/L (ref 96–112)
Creatinine, Ser: 0.88 mg/dL (ref 0.4–1.2)
GFR calc non Af Amer: >60 mL/min (ref >60)
Total Bilirubin: 0.3 mg/dL (ref 0.3–1.2)

## 2010-07-19 LAB — CBC
HCT: 36.2 % (ref 36.0–46.0)
MCH: 30.8 pg (ref 26.0–34.0)
MCV: 91.4 fL (ref 78.0–100.0)
RDW: 13.7 % (ref 11.5–15.5)
WBC: 15.1 10*3/uL — ABNORMAL HIGH (ref 4.0–10.5)

## 2010-07-19 LAB — DIFFERENTIAL
Eosinophils Relative: 0 % (ref 0–5)
Lymphocytes Relative: 13 % (ref 12–46)
Lymphs Abs: 2 10*3/uL (ref 0.7–4.0)
Monocytes Relative: 6 % (ref 3–12)

## 2010-07-19 LAB — TSH: TSH: 2.129 u[IU]/mL (ref 0.350–4.500)

## 2010-07-19 LAB — INFLUENZA PANEL BY PCR (TYPE A & B): H1N1 flu by pcr: NOT DETECTED

## 2010-07-20 LAB — BASIC METABOLIC PANEL
BUN: 15 mg/dL (ref 6–23)
CO2: 26 mEq/L (ref 19–32)
Chloride: 97 mEq/L (ref 96–112)
Creatinine, Ser: 0.88 mg/dL (ref 0.4–1.2)

## 2010-07-20 LAB — DIFFERENTIAL
Lymphocytes Relative: 16 % (ref 12–46)
Lymphs Abs: 1.8 10*3/uL (ref 0.7–4.0)
Neutrophils Relative %: 76 % (ref 43–77)

## 2010-07-20 LAB — CBC
HCT: 33.3 % — ABNORMAL LOW (ref 36.0–46.0)
Hemoglobin: 11.2 g/dL — ABNORMAL LOW (ref 12.0–15.0)
MCV: 90.2 fL (ref 78.0–100.0)
RBC: 3.69 MIL/uL — ABNORMAL LOW (ref 3.87–5.11)
WBC: 11.4 10*3/uL — ABNORMAL HIGH (ref 4.0–10.5)

## 2010-07-20 LAB — PREALBUMIN: Prealbumin: 10.6 mg/dL — ABNORMAL LOW (ref 17.0–34.0)

## 2010-07-20 NOTE — H&P (Signed)
NAMEMarland Kitchen  Baker, [name] Baker NO.:  [id number]  MEDICAL RECORD NO.:  [id number]  LOCATION:  A311                          FACILITY:  [facility]  PHYSICIAN:  [name] Baker, M.D.  DATE OF BIRTH:  01/11/1931  DATE OF ADMISSION:  07/18/2010 DATE OF DISCHARGE:  LH                             HISTORY & PHYSICAL   PRIMARY MD:  Michele Rear. Fusco, MD  CHIEF COMPLAINT:  Cough, congestion, and weakness, associated with fever for few days.  HISTORY OF PRESENT ILLNESS:  This is a 77 year old female.  She is demented and quite unable to contribute to history, which is supplied by her son, [name] who was present in the Emergency Department.  According to him, the patient has had a nonproductive cough for few days and appeared congested, weak and had a temperature on the night of July 17, 2010.  There was no vomiting, diarrhea, or shortness of breath. Appetite appears good.  She however has experienced functional decline.  PAST MEDICAL HISTORY: 1. Dysphagia secondary to Schatzki ring, status post esophageal     dilatation in June 2008. 2. CKD 2-3. 3. Lumbar spinal stenosis, status post back surgery on Jun 24, 2002. 4. Status post right hip replacement in January 2010. 5. Sigmoid diverticulosis. 6. Hemorrhoids, status post hemorrhoidectomy in March 2001. 7. Status post ovarian cystectomy. 8. Hypertension. 9. Chronic constipation. 10.Protein calorie malnutrition. 11.Dementia.  ALLERGIES:  VICODIN, this causes a rash.  MEDICATIONS HISTORY:  This appears incomplete and will be updated by clinical pharmacologist, however includes the following; 1. Aspirin 81 mg p.o. daily. 2. Potassium chloride 20 mEq p.o. daily. 3. Senna 1 tablet p.o. b.i.d. 4. Boost therapy 2 tablespoons p.o. b.i.d. 5. Alprazolam 0.5 mg p.o. q.8 h. 6. Diphenhydramine hydrochloride 50 mg p.o. q.6 h. 7. Famotidine 20 mg p.o. b.i.d. 8. Bisacodyl suppository insert p.r.n. 9. Sertraline 50 mg p.o.  daily.  REVIEW OF SYSTEMS:  As per HPI and chief complaint.  There is no abdominal pain, vomiting, or diarrhea.  The patient appears to be moving bowels regularly, has not had any swallowing difficulties, tolerates a regular diet, according to son appetite appears good.  She has however had difficulty with functioning, is unable to transfer or ambulate without assistance.  Rest of systems review is negative.  SOCIAL HISTORY:  The patient is a resident of [facility], since July 12, 2010.  She is a nonsmoker and nondrinker.  She has no history of drug abuse.  She has two offspring, i.e. son, [name] and [name].  [name] that is the one -- stays in [place] and is the main caregiver.  FAMILY HISTORY:  The patient's father died at age 62 years, he had diabetes mellitus, which reportedly was diet controlled.  The patient's mother died at age 70 years.  She had dementia.  PHYSICAL EXAMINATION:  VITAL SIGNS:  Temperature maximum 102.7, pulse 110 per minute regular, respiratory rate 20, BP 131/71 mmHg, pulse oximeter 98% on room air. GENERAL:  The patient did not appear to be in obvious acute distress during the course of this evaluation.  The patient was coughing intermittently.  There was no clinical pallor.  No jaundice.  No conjunctival injection. THROAT:  Visible mucous membranes appear clear. NECK:  Supple.  JVP not seen.  No palpable lymphadenopathy.  No palpable goiter. CHEST:  Clinically clear to auscultation.  No wheezes.  No crackles. HEART:  Sounds 1 and 2 heard normal, regular, mildly tachycardic.  No murmurs. ABDOMEN:  Full.  Scaphoid in the upper part.  Slightly distended in the lower part.  Nontender, soft, no palpable organomegaly.  No palpable masses.  Normal bowel sounds. LOWER EXTREMITIES:  No pitting edema.  Palpable peripheral pulses. MUSCULOSKELETAL:  Osteoarthritic changes are evident. CENTRAL NERVOUS SYSTEM:  No  focal neurologic deficit on gross examination.  INVESTIGATIONS:  CBC; WBC 12.2, hemoglobin 12.9, hematocrit 38.7, and platelets 160.  Electrolytes; sodium 132, potassium 4.2, chloride 96, CO2 of 25, BUN 28, creatinine 0.95, and glucose 98.  Urinalysis shows wbc's 21-50, rbc 11-20, bacteria many.  Chest x-ray on July 18, 2010, showed no acute findings.  ASSESSMENT AND PLAN: 1. Possible acute bronchitis, although this may just be a viral upper     respiratory illness.  We shall commence the patient for now on     intravenous Levaquin.  Manage with Mucinex and p.r.n.     bronchodilator nebulizers.  2. Urinary tract infection.  This will be adequately covered by     Levaquin.  We shall send off urine cultures and blood cultures,     particularly as the patient has systemic inflammatory response.  3. Dehydration.  This will be managed with intravenous fluids.  4. Sinus tachycardia.  This is secondary to volume depletion, as well     as febrile illness and will be adequately managed with intravenous     fluids as described above.  5. Protein calorie malnutrition.  This appears severe.  We shall     request nutritionist consultation, for dietary recommendations.  6. History of hypertension.  The patient does not currently appear to     be on antihypertensive medications.  Blood pressure appears     controlled, we shall monitor for now.  Note:  The patient had DNR status established at the time of discharge from her last hospitalization in April 2012.  ED MD has discussed with the patient's son, [name] and he has confirmed DNR/DNI status which we shall respect during the course of this hospitalization.   Further management will depend on clinical course.     [name] Baker, M.D.     CO/MEDQ  D:  07/18/2010  T:  07/18/2010  Job:  [id number]  cc:   Michele Rear. Sherwood Gambler, MD Fax: [phone]  Electronically Signed by [name] Baker M.D. on 07/20/2010 06:36:53 PM

## 2010-07-21 ENCOUNTER — Inpatient Hospital Stay (HOSPITAL_COMMUNITY): Payer: Medicare Other

## 2010-07-21 LAB — CBC
MCH: 30.7 pg (ref 26.0–34.0)
MCV: 88.9 fL (ref 78.0–100.0)
Platelets: 157 10*3/uL (ref 150–400)
RBC: 3.88 MIL/uL (ref 3.87–5.11)

## 2010-07-21 LAB — BASIC METABOLIC PANEL
BUN: 11 mg/dL (ref 6–23)
CO2: 25 mEq/L (ref 19–32)
Calcium: 8.5 mg/dL (ref 8.4–10.5)
Creatinine, Ser: 0.75 mg/dL (ref 0.4–1.2)
Glucose, Bld: 90 mg/dL (ref 70–99)

## 2010-07-21 LAB — DIFFERENTIAL
Eosinophils Absolute: 0.2 10*3/uL (ref 0.0–0.7)
Lymphs Abs: 1.6 10*3/uL (ref 0.7–4.0)
Monocytes Relative: 6 % (ref 3–12)
Neutrophils Relative %: 76 % (ref 43–77)

## 2010-07-21 NOTE — Group Therapy Note (Signed)
  NAMEMarland Baker  ANALYSA, NUTTING NO.:  [id number]  MEDICAL RECORD NO.:  [id number]  LOCATION:  A311                          FACILITY:  [facility]  PHYSICIAN:  Wilson Singer, M.D.DATE OF BIRTH:  18-Apr-1930  DATE OF PROCEDURE:  07/21/2010 DATE OF DISCHARGE:                                PROGRESS NOTE   This lady was admitted with cough, congestion, and weakness.  She appears to have possibly UTI but urine culture actually has been negative and so far blood cultures showing staphylococcal species in only 1 bottle out of 4.  We will await hopefully full sensitivities. Family was at the bedside which consisted of a brother-in-law and a sister-in-law worried about her deep cough.  PHYSICAL EXAMINATION:  She looks systemically, temperature 98.5, blood pressure 132/66, pulse 100 and possibly irregular, saturation 94% on room air.  Heart sounds are present and irregular.  I am not clear whether this is atrial fibrillation or not.  Lung fields are essentially clear.  Neurologically she is alert but I think is back to her baseline demented state that she had been previously.  INVESTIGATIONS:  Sodium 130, potassium 3.3, bicarbonate 25, BUN 11, creatinine 0.75, hemoglobin 11.9, white blood cell count 9.9, platelets 157,000.  IMPRESSION: 1. Bronchitis. 2. Dehydration, resolved. 3. Staphylococcal species and blood cultures, unclear significance. 4. Hypertension controlled. 5. Hypokalemia.  PLAN: 1. Replete potassium. 2. Discontinue IV fluids and encourage oral p.o. intake. 3. Repeat chest x-ray. 4. Electrocardiogram 5. Possible discharge tomorrow depending on condition.    Wilson Singer, M.D.    NCG/MEDQ  D:  07/21/2010  T:  07/21/2010  Job:  [id number]  Electronically Signed by Lilly Cove M.D. on 07/21/2010 05:05:33 PM

## 2010-07-22 LAB — COMPREHENSIVE METABOLIC PANEL
ALT: 13 U/L (ref 0–35)
Alkaline Phosphatase: 64 U/L (ref 39–117)
CO2: 27 mEq/L (ref 19–32)
Calcium: 8.6 mg/dL (ref 8.4–10.5)
GFR calc Af Amer: >60 mL/min (ref >60)
GFR calc non Af Amer: >60 mL/min (ref >60)
Glucose, Bld: 87 mg/dL (ref 70–99)
Sodium: 132 mEq/L — ABNORMAL LOW (ref 135–145)

## 2010-07-22 LAB — CULTURE, BLOOD (ROUTINE X 2): Culture  Setup Time: 201206120040

## 2010-07-22 LAB — DIFFERENTIAL
Basophils Relative: 0 % (ref 0–1)
Lymphocytes Relative: 22 % (ref 12–46)
Lymphs Abs: 1.9 10*3/uL (ref 0.7–4.0)
Monocytes Relative: 8 % (ref 3–12)
Neutro Abs: 5.7 10*3/uL (ref 1.7–7.7)
Neutrophils Relative %: 67 % (ref 43–77)

## 2010-07-22 LAB — URINE CULTURE: Colony Count: >=100000

## 2010-07-22 LAB — CBC
HCT: 32 % — ABNORMAL LOW (ref 36.0–46.0)
Hemoglobin: 11.1 g/dL — ABNORMAL LOW (ref 12.0–15.0)
MCH: 30.2 pg (ref 26.0–34.0)
RBC: 3.68 MIL/uL — ABNORMAL LOW (ref 3.87–5.11)

## 2010-07-22 NOTE — Discharge Summary (Signed)
[name], [name]               ACCOUNT NO.:  [id number]  MEDICAL RECORD NO.:  [id number]  LOCATION:  A311                          FACILITY:  [facility]  PHYSICIAN:  Wilson Singer, M.D.DATE OF BIRTH:  May 22, 1930  DATE OF ADMISSION:  07/18/2010 DATE OF DISCHARGE:  LH                              DISCHARGE SUMMARY   FINAL DISCHARGE DIAGNOSES: 1. Bronchitis. 2. Dehydration. 3. Protein-calorie malnutrition. 4. Worsening dementia. 5. Elevated blood pressure. 6. Do not resuscitate. 7. Urinary tract infection.  CONDITION ON DISCHARGE:  Stable.  MEDICATIONS ON DISCHARGE: 1. Ceftin 500 mg b.i.d. for one week. 2. Alprazolam 0.5 mg every 8 h. p.r.n. for anxiety. 3. Aspirin 81 mg daily. 4. Famotidine 20 mg b.i.d. p.r.n. 5. Potassium chloride 20 mEq daily. 6. Senokot-S 1 tablet b.i.d. 7. Sertraline 50 mg daily. 8. Diphenhydramine 50 mg every 6 h. p.r.n. 9. Bisacodyl rectal suppository 10 mg p.r.n.  CONDITION ON DISCHARGE:  Stable.  HISTORY:  This unfortunate 77 year old lady was admitted with symptoms of cough, congestion and weakness with fever for a few days.  Please see initial history and physical examination done by Dr. Isidor Holts.  HOSPITAL PROGRESS:  The patient was admitted and started on intravenous antibiotics with IV vancomycin and Levaquin.  Blood cultures grew a gram positive coccus, but on only one bottle and final identification was a staph coagulase negative species.  This is likely to be a contamination. Urine culture did grow morganii and this has intermediate sensitivity with levofloxacin.  The patient was sensitive to ceftriaxone with this UTI.  The patient somewhat improved, but has had poor p.o. intake and is somewhat lethargic.  The family felt that she could receive the care at [facility] just as well as in the hospital, providing she was stable.  Today, she looks reasonably stable.  PHYSICAL EXAMINATION:  VITAL SIGNS:  Temperature  97.6, blood pressure 109/65, pulse 88, saturation 95% on room air. CARDIAC:  Heart sounds are present and normal. CHEST:  Lung fields are actually clear.  She is sleeping at the present time, but has been alert previously per nursing staff.  INVESTIGATIONS:  Today, show hemoglobin of 11.1, white blood cell count 8.5, platelets 157.  Sodium 132, potassium 3.1, bicarbonate 27, BUN 12, creatinine 0.78.  She had a chest x-ray yesterday which showed possible pneumonia with bibasilar airspace disease.  Clinically, however, she looks well.  DISPOSITION:  The patient is stable to be discharged, but she will need to continue a course of Ceftin 500 mg b.i.d. for one further week and then should complete her course for the UTI.  I would suggest the repeat urine culture be taken after this to make sure all her urine infection has cleared.  Also, I would recommend a repeat chest x-ray in about 4-6 weeks' time to make sure the airspace disease seen is clearing also.  I have asked that she have a followup appointment with her primary care physician in about a month's time.     Wilson Singer, M.D.     NCG/MEDQ  D:  07/22/2010  T:  07/22/2010  Job:  [id number]  cc:   Madelin Rear. Sherwood Gambler, MD  Fax: [phone]  Electronically Signed by Lilly Cove M.D. on 07/22/2010 12:12:05 PM

## 2010-07-23 LAB — CULTURE, BLOOD (ROUTINE X 2)
Culture: NO GROWTH
Culture: NO GROWTH
Culture: NO GROWTH

## 2010-09-30 ENCOUNTER — Encounter: Payer: Self-pay | Admitting: Emergency Medicine

## 2010-09-30 ENCOUNTER — Emergency Department (HOSPITAL_COMMUNITY): Payer: Medicare Other

## 2010-09-30 ENCOUNTER — Inpatient Hospital Stay (HOSPITAL_COMMUNITY)
Admission: EM | Admit: 2010-09-30 | Discharge: 2010-10-03 | DRG: 603 | Disposition: A | Discharge status: Nursing Home (Includes ICF) | Payer: Medicare Other | Attending: Internal Medicine | Admitting: Internal Medicine

## 2010-09-30 DIAGNOSIS — Z66 Do not resuscitate: Secondary | ICD-10-CM | POA: Diagnosis present

## 2010-09-30 DIAGNOSIS — L0390 Cellulitis, unspecified: Secondary | ICD-10-CM | POA: Diagnosis present

## 2010-09-30 DIAGNOSIS — M1120 Other chondrocalcinosis, unspecified site: Secondary | ICD-10-CM | POA: Diagnosis present

## 2010-09-30 DIAGNOSIS — L03119 Cellulitis of unspecified part of limb: Principal | ICD-10-CM | POA: Diagnosis present

## 2010-09-30 DIAGNOSIS — F418 Other specified anxiety disorders: Secondary | ICD-10-CM

## 2010-09-30 DIAGNOSIS — J438 Other emphysema: Secondary | ICD-10-CM | POA: Diagnosis present

## 2010-09-30 DIAGNOSIS — R509 Fever, unspecified: Secondary | ICD-10-CM

## 2010-09-30 DIAGNOSIS — F0390 Unspecified dementia without behavioral disturbance: Secondary | ICD-10-CM | POA: Diagnosis present

## 2010-09-30 DIAGNOSIS — D649 Anemia, unspecified: Secondary | ICD-10-CM | POA: Diagnosis not present

## 2010-09-30 DIAGNOSIS — L02519 Cutaneous abscess of unspecified hand: Principal | ICD-10-CM | POA: Diagnosis present

## 2010-09-30 DIAGNOSIS — R3129 Other microscopic hematuria: Secondary | ICD-10-CM | POA: Diagnosis present

## 2010-09-30 DIAGNOSIS — M11249 Other chondrocalcinosis, unspecified hand: Secondary | ICD-10-CM | POA: Diagnosis present

## 2010-09-30 DIAGNOSIS — F341 Dysthymic disorder: Secondary | ICD-10-CM | POA: Diagnosis present

## 2010-09-30 HISTORY — DX: Other specified anxiety disorders: F41.8

## 2010-09-30 HISTORY — DX: Unspecified dementia, unspecified severity, without behavioral disturbance, psychotic disturbance, mood disturbance, and anxiety: F03.90

## 2010-09-30 HISTORY — DX: Urinary tract infection, site not specified: N39.0

## 2010-09-30 HISTORY — DX: Depression, unspecified: F32.A

## 2010-09-30 HISTORY — DX: Syncope and collapse: R55

## 2010-09-30 HISTORY — DX: Pneumonia, unspecified organism: J18.9

## 2010-09-30 HISTORY — DX: Cerebral infarction, unspecified: I63.9

## 2010-09-30 HISTORY — DX: Tachycardia, unspecified: R00.0

## 2010-09-30 HISTORY — DX: Major depressive disorder, single episode, unspecified: F32.9

## 2010-09-30 HISTORY — DX: Rhabdomyolysis: M62.82

## 2010-09-30 HISTORY — DX: Acute kidney failure, unspecified: N17.9

## 2010-09-30 HISTORY — DX: Essential (primary) hypertension: I10

## 2010-09-30 HISTORY — DX: Diverticulosis of intestine, part unspecified, without perforation or abscess without bleeding: K57.90

## 2010-09-30 HISTORY — DX: Personal history of other venous thrombosis and embolism: Z86.718

## 2010-09-30 HISTORY — DX: Chronic kidney disease, stage 2 (mild): N18.2

## 2010-09-30 HISTORY — DX: Retention of urine, unspecified: R33.9

## 2010-09-30 LAB — URINE MICROSCOPIC-ADD ON

## 2010-09-30 LAB — COMPREHENSIVE METABOLIC PANEL
AST: 23 U/L (ref 0–37)
Albumin: 3.7 g/dL (ref 3.5–5.2)
Alkaline Phosphatase: 72 U/L (ref 39–117)
Chloride: 98 mEq/L (ref 96–112)
Potassium: 4.3 mEq/L (ref 3.5–5.1)
Total Bilirubin: 0.5 mg/dL (ref 0.3–1.2)

## 2010-09-30 LAB — URINALYSIS, ROUTINE W REFLEX MICROSCOPIC
Glucose, UA: NEGATIVE mg/dL
Ketones, ur: NEGATIVE mg/dL

## 2010-09-30 LAB — CBC
Hemoglobin: 12.7 g/dL (ref 12.0–15.0)
MCHC: 32.6 g/dL (ref 30.0–36.0)
RDW: 14.5 % (ref 11.5–15.5)

## 2010-09-30 LAB — DIFFERENTIAL
Basophils Absolute: 0 10*3/uL (ref 0.0–0.1)
Basophils Relative: 0 % (ref 0–1)
Neutro Abs: 9.7 10*3/uL — ABNORMAL HIGH (ref 1.7–7.7)
Neutrophils Relative %: 74 % (ref 43–77)

## 2010-09-30 MED ORDER — SODIUM CHLORIDE 0.9 % IV SOLN
INTRAVENOUS | Status: DC
  Filled 2010-09-30 (×5): qty 1000

## 2010-09-30 MED ORDER — ACETAMINOPHEN 325 MG PO TABS: 650.0000 mg | ORAL_TABLET | Freq: Four times a day (QID) | ORAL | Status: DC | PRN

## 2010-09-30 MED ORDER — ONDANSETRON HCL 4 MG/2ML IJ SOLN: 4.0000 mg | Freq: Four times a day (QID) | INTRAMUSCULAR | Status: DC | PRN

## 2010-09-30 MED ORDER — ASPIRIN EC 81 MG PO TBEC
81.0000 mg | DELAYED_RELEASE_TABLET | Freq: Every day | ORAL | Status: DC
  Administered 2010-10-01 – 2010-10-03 (×3): 81 mg via ORAL
  Filled 2010-09-30 (×3): qty 1

## 2010-09-30 MED ORDER — DEXTROSE 5 % IV SOLN
1.0000 g | Freq: Once | INTRAVENOUS | Status: DC
  Filled 2010-09-30: qty 1

## 2010-09-30 MED ORDER — PRO-STAT SUGAR FREE PO LIQD
30.0000 mL | Freq: Two times a day (BID) | ORAL | Status: DC
  Administered 2010-09-30 – 2010-10-03 (×5): 30 mL via ORAL
  Filled 2010-09-30 (×3): qty 30

## 2010-09-30 MED ORDER — VANCOMYCIN HCL IN DEXTROSE 1-5 GM/200ML-% IV SOLN
1000.0000 mg | Freq: Once | INTRAVENOUS | Status: AC
  Administered 2010-09-30: 1000 mg via INTRAVENOUS
  Filled 2010-09-30: qty 200

## 2010-09-30 MED ORDER — SODIUM CHLORIDE 0.9 % IV SOLN
INTRAVENOUS | Status: AC
  Administered 2010-09-30: 16:00:00 via INTRAVENOUS

## 2010-09-30 MED ORDER — SODIUM CHLORIDE 0.9 % IJ SOLN
INTRAMUSCULAR | Status: AC
  Filled 2010-09-30: qty 10

## 2010-09-30 MED ORDER — DEXTROSE 5 % IV SOLN
1.0000 g | Freq: Once | INTRAVENOUS | Status: AC
  Administered 2010-09-30: 1 g via INTRAVENOUS
  Filled 2010-09-30: qty 1

## 2010-09-30 MED ORDER — SERTRALINE HCL 50 MG PO TABS
50.0000 mg | ORAL_TABLET | Freq: Every day | ORAL | Status: DC
  Administered 2010-10-01 – 2010-10-03 (×3): 50 mg via ORAL
  Filled 2010-09-30 (×3): qty 1

## 2010-09-30 MED ORDER — SODIUM CHLORIDE 0.9 % IV SOLN
INTRAVENOUS | Status: DC
  Filled 2010-09-30 (×2): qty 1000

## 2010-09-30 MED ORDER — SENNOSIDES-DOCUSATE SODIUM 8.6-50 MG PO TABS
1.0000 | ORAL_TABLET | Freq: Two times a day (BID) | ORAL | Status: DC
  Administered 2010-09-30 – 2010-10-03 (×6): 1 via ORAL
  Filled 2010-09-30 (×6): qty 1

## 2010-09-30 MED ORDER — ONDANSETRON HCL 4 MG PO TABS: 2.0000 mg | ORAL_TABLET | Freq: Four times a day (QID) | ORAL | Status: DC | PRN

## 2010-09-30 MED ORDER — BISACODYL 10 MG RE SUPP: 10.0000 mg | Freq: Every day | RECTAL | Status: DC | PRN

## 2010-09-30 MED ORDER — IBUPROFEN 400 MG PO TABS
200.0000 mg | ORAL_TABLET | Freq: Two times a day (BID) | ORAL | Status: DC
  Administered 2010-09-30 – 2010-10-03 (×6): 200 mg via ORAL
  Filled 2010-09-30: qty 2
  Filled 2010-09-30 (×4): qty 1
  Filled 2010-09-30: qty 2

## 2010-09-30 MED ORDER — ALPRAZOLAM 0.5 MG PO TABS
0.5000 mg | ORAL_TABLET | Freq: Three times a day (TID) | ORAL | Status: DC
  Administered 2010-09-30 – 2010-10-03 (×9): 0.5 mg via ORAL
  Filled 2010-09-30 (×9): qty 1

## 2010-09-30 MED ORDER — ACETAMINOPHEN 650 MG RE SUPP: 650.0000 mg | Freq: Four times a day (QID) | RECTAL | Status: DC | PRN

## 2010-09-30 NOTE — H&P (Signed)
[name] MRN: [id number] DOB/AGE: January 05, 1931 77 y.o. Primary Care Physician:No primary provider on file. Admit date: 09/30/2010 Chief Complaint: Fever and generalized weakness.  HPI:  The patient is a 77 year old woman with a past medical history significant for advanced dementia, hypertension, chronic constipation. She presents to the hospital today from [facility], with a report of fever and generalized weakness. The patient does not provide any history as she has advanced dementia. The history is provided by [name]. Accordingly, the patient was in her usual state of health until this morning. She was noted to be more generally weak this morning. She also felt hot to the touch. [name] took her temperature and it was 101.2. The patient was having difficulty putting the spoon to her mouth this morning when she was trying to keep breakfast. The [name] noted that her right hand was red and swollen. She does not recall any trauma. The patient has not had a cough, diarrhea, nausea, or vomiting. She did have some urinary frequency last night. She has had no obvious abdominal pain, chest pain, or shortness of breath.  In the emergency department, the patient is noted to be hemodynamically stable. She is febrile with a temperature of 101.6. Her white blood cell count is elevated at 13.0. Her chest x-ray reveals emphysematous changes but no acute process. The x-ray of her right hand reveals significant degenerative changes at the intercarpal and MCP  joints, interphalangeal joints, and severe osseous demineralization. At the wrist, there is a suggestion of pseudogout. Her urinalysis is significant for 11-20 RBCs, rare bacteria, and 0-2 WBCs. She is being admitted for further evaluation and management.  Past Medical History  Diagnosis Date  . History of DVT (deep vein thrombosis)     Old LE DVT per doppler 4/12  . Depression   . Dysphagia     Schatzki ring  dilatation 07/2006  . Diverticulosis   . Hypertension   . Dementia   . Tachycardia   . Stroke   . Rhabdomyolysis     05/2010 after being found down at home.  . ARF (acute renal failure)     05/2010. Resolved.  Marland Kitchen PNA (pneumonia)   . CKD (chronic kidney disease), stage II   . Hemorrhoids   . UTI (lower urinary tract infection)   . Urinary retention     Hx of in 05/2010.  Marland Kitchen Near syncope   . Constipation     Past Surgical History  Procedure Date  . Hemorroidectomy   . Ovarian cyst removal   . Total hip arthroplasty   . Lumbar back surgery     2004.    MEDICATIONS:  1. Alprazolam 0.5 mg 3 times daily. 2. Enteric-coated aspirin 81 mg daily. 3. Pro-stat, sugar free 64 liquid, 30 cc twice a day. 4. Pollen extracts 30 cc twice a day. 5. Senokot S1 tablet twice a day. 6. Zoloft 50 mg daily. (The dose was recently increased to 100 mg daily) 7. Dulcolax suppository 10 mg daily as needed for constipation.  Allergies:  Allergies  Allergen Reactions  . Hydrocodone     Rash  . Strawberry Other (See Comments)    Unknown    History reviewed. No pertinent family history.  Social History:  The patient is widowed. She has 2 sons. One of her sons, Mr. Alease Baker, is her healthcare power of attorney. [name] is her primary caretaker. The patient is a resident of her group home.  She is retired from the [employer]. No known history of alcohol or tobacco use. At baseline, the patient has a fairly good appetite. She needs assistance with all activities of daily living. She does not ambulate but she does transfer well. She sits in a wheelchair or a recliner most of the day every day.    Family history: Her father died at 73 years of age of complications of diabetes mellitus. Her mother died at 57 years of age of dementia.   ROS: Positive for chronic confusion, difficulty sleeping at night, and, chronic constipation. Otherwise review of systems is negative  PHYSICAL  EXAM: Blood pressure 126/59, pulse 93, temperature 101.6 F (38.7 C), temperature source Rectal, resp. rate 20, height 5\' 7"  (1.702 m), weight 47.628 kg (105 lb), SpO2 99.00%.  General: The patient is currently lying in bed in no acute distress. She is alert but confused. HEENT: Head is normocephalic nontraumatic. Pupils are equal round and reactive to light. Extraocular movements are intact. Conjunctivae are clear.. Sclerae are white. Oropharynx reveals moist mucous membranes. No posterior exudates or erythema. Neck: Supple no adenopathy, no thyromegaly, no JVD. Lungs: Clear to auscultation bilaterally. Breathing is nonlabored. Heart: S1, S2, with no murmurs rubs or gallops. Abdomen: Positive bowel sounds, soft, nontender, and nondistended. GU: Indwelling Foley catheter draining yellow urine. Rectal: Deferred Extremities: Right hand with global edema. There is a large area of erythema and edema over her right dorsum. It is mildly tender. There is no appreciable induration. She does not understand to flex and extend her hand upon command. Active range of motion by the examining physician reveals no obvious range of motion abnormalities. Radial pulse intact. No pedal edema. No pretibial edema. Pedal pulses barely palpable. Neurologic: She is alert. She mumbles. She does follow small simple commands. She is chronically confused. Cranial nerves II through XII are grossly intact.     Basic Metabolic Panel:  Basename 09/30/10 1351  NA 136  K 4.3  CL 98  CO2 26  GLUCOSE 120*  BUN 22  CREATININE 0.85  CALCIUM 9.3  MG --  PHOS --   Liver Function Tests:  Basename 09/30/10 1351  AST 23  ALT 15  ALKPHOS 72  BILITOT 0.5  PROT 7.4  ALBUMIN 3.7   No results found for this basename: LIPASE:2,AMYLASE:2 in the last 72 hours CBC:  Basename 09/30/10 1351  WBC 13.0*  NEUTROABS 9.7*  HGB 12.7  HCT 38.9  MCV 87.4  PLT 178   Cardiac Enzymes: No results found for this basename:  CKTOTAL:3,CKMB:3,CKMBINDEX:3,TROPONINI:3 in the last 72 hours BNP: No results found for this basename: POCBNP:3 in the last 72 hours D-Dimer: No results found for this basename: DDIMER:2 in the last 72 hours CBG: No results found for this basename: GLUCAP:6 in the last 72 hours Hemoglobin A1C: No results found for this basename: HGBA1C in the last 72 hours Fasting Lipid Panel: No results found for this basename: CHOL,HDL,LDLCALC,TRIG,CHOLHDL,LDLDIRECT in the last 72 hours Thyroid Function Tests: No results found for this basename: TSH,T4TOTAL,FREET4,T3FREE,THYROIDAB in the last 72 hours Anemia Panel: No results found for this basename: VITAMINB12,FOLATE,FERRITIN,TIBC,IRON,RETICCTPCT in the last 72 hours     No results found for this or any previous visit (from the past 240 hour(s)).   Results for orders placed during the hospital encounter of 09/30/10 (from the past 48 hour(s))  CBC     Status: Abnormal   Collection Time   09/30/10  1:51 PM      Component Value  Range Comment   WBC 13.0 (*) 4.0 - 10.5 (K/uL)    RBC 4.45  3.87 - 5.11 (MIL/uL)    Hemoglobin 12.7  12.0 - 15.0 (g/dL)    HCT 40.9  81.1 - 91.4 (%)    MCV 87.4  78.0 - 100.0 (fL)    MCH 28.5  26.0 - 34.0 (pg)    MCHC 32.6  30.0 - 36.0 (g/dL)    RDW 78.2  95.6 - 21.3 (%)    Platelets 178  150 - 400 (K/uL)   DIFFERENTIAL     Status: Abnormal   Collection Time   09/30/10  1:51 PM      Component Value Range Comment   Neutrophils Relative 74  43 - 77 (%)    Neutro Abs 9.7 (*) 1.7 - 7.7 (K/uL)    Lymphocytes Relative 16  12 - 46 (%)    Lymphs Abs 2.0  0.7 - 4.0 (K/uL)    Monocytes Relative 10  3 - 12 (%)    Monocytes Absolute 1.3 (*) 0.1 - 1.0 (K/uL)    Eosinophils Relative 0  0 - 5 (%)    Eosinophils Absolute 0.0  0.0 - 0.7 (K/uL)    Basophils Relative 0  0 - 1 (%)    Basophils Absolute 0.0  0.0 - 0.1 (K/uL)   COMPREHENSIVE METABOLIC PANEL     Status: Abnormal   Collection Time   09/30/10  1:51 PM      Component  Value Range Comment   Sodium 136  135 - 145 (mEq/L)    Potassium 4.3  3.5 - 5.1 (mEq/L)    Chloride 98  96 - 112 (mEq/L)    CO2 26  19 - 32 (mEq/L)    Glucose, Bld 120 (*) 70 - 99 (mg/dL)    BUN 22  6 - 23 (mg/dL)    Creatinine, Ser 0.86  0.50 - 1.10 (mg/dL)    Calcium 9.3  8.4 - 10.5 (mg/dL)    Total Protein 7.4  6.0 - 8.3 (g/dL)    Albumin 3.7  3.5 - 5.2 (g/dL)    AST 23  0 - 37 (U/L)    ALT 15  0 - 35 (U/L)    Alkaline Phosphatase 72  39 - 117 (U/L)    Total Bilirubin 0.5  0.3 - 1.2 (mg/dL)    GFR calc non Af Amer >60  >60 (mL/min)    GFR calc Af Amer >60  >60 (mL/min)   URINALYSIS, ROUTINE W REFLEX MICROSCOPIC     Status: Abnormal   Collection Time   09/30/10  1:56 PM      Component Value Range Comment   Color, Urine YELLOW  YELLOW     Appearance CLEAR  CLEAR     Specific Gravity, Urine 1.020  1.005 - 1.030     pH 6.5  5.0 - 8.0     Glucose, UA NEGATIVE  NEGATIVE (mg/dL)    Hgb urine dipstick LARGE (*) NEGATIVE     Bilirubin Urine NEGATIVE  NEGATIVE     Ketones, ur NEGATIVE  NEGATIVE (mg/dL)    Protein, ur TRACE (*) NEGATIVE (mg/dL)    Urobilinogen, UA 0.2  0.0 - 1.0 (mg/dL)    Nitrite NEGATIVE  NEGATIVE     Leukocytes, UA NEGATIVE  NEGATIVE    URINE MICROSCOPIC-ADD ON     Status: Abnormal   Collection Time   09/30/10  1:56 PM      Component Value Range Comment  Squamous Epithelial / LPF FEW (*) RARE     WBC, UA 0-2  <3 (WBC/hpf)    RBC / HPF 11-20  <3 (RBC/hpf)    Bacteria, UA RARE  RARE      Dg Chest 1 View  09/30/2010  *RADIOLOGY REPORT*  Clinical Data: Chest pain, altered mental status  CHEST - 1 VIEW  Comparison: 07/21/2010  Findings: Upper normal heart size. Minimal elongation and calcification of thoracic aorta. Pulmonary vascularity normal. Emphysematous changes with bilateral upper lobe scarring and volume loss. No pulmonary infiltrate, pleural effusion or pneumothorax. Bones diffusely demineralized. Mild broad-based levoconvex thoracic scoliosis.  IMPRESSION:  Emphysematous changes with bilateral upper lobe scarring and volume loss. No acute abnormalities.  Original Report Authenticated By: Lollie Marrow, M.D.   Dg Hand 2 View Right  09/30/2010  *RADIOLOGY REPORT*  Clinical Data: Hand pain, altered mental status  RIGHT HAND - 2 VIEW  Comparison: None  Findings: Fingers superimposed on lateral view, due to patient condition, limiting assessment.  Severe osseous demineralization. Diffuse degenerative changes, joint space narrowing, and spur formation at multiple interphalangeal joints. Associated subluxations at the PIP joints of the middle and index fingers. Scattered intercarpal degenerative changes as well. No significant narrowing noted at the MCP joints. Minimal chondrocalcinosis at wrist. Calcified soft tissue nodule adjacent to the head of the proximal phalanx ring finger. No acute fracture, dislocation or bone destruction.  IMPRESSION: Severe osseous demineralization. Significant degenerative changes at interphalangeal joints and to a lesser degree intercarpal and MCP joints. No definite acute bony findings. Chondrocalcinosis at wrist, question CPPD/pseudogout.  Original Report Authenticated By: Lollie Marrow, M.D.    Impression:   1. Cellulitis of the right dorsal surface of her hand. This appears to be the only obvious source of her fever. There is a suggestion of pseudogout on the x-ray of her hand.  2. Microhematuria. This may be secondary to the indwelling Foley catheter. No WBCs and no nitrite seen on the urinalysis.  3. Chronic advanced dementia. She is a DO NOT RESUSCITATE per my conversation with [name]. She will bring the out of facility DO NOT RESUSCITATE form tomorrow.  4. Emphysematous changes noted on the chest x-ray.  5. Chronic depression with anxiety. The dose of Zoloft was recently increased from 50 mg to 100 mg daily. In this setting, I will continue 50 mg of Zoloft rather than 100 mg.    Plan: 1. Vancomycin and Rocephin  were started in the emergency department. We will continue these antibiotics for now. We'll check blood cultures x2. We'll treat her pain with as needed Tylenol and scheduled twice a day dosing of ibuprofen for several days. Gentle IV fluids. Order a urine culture. Will check a TSH and free T4. SCDs for DVT prophylaxis.      Shadrick Senne 09/30/2010, 6:01 PM

## 2010-09-30 NOTE — ED Provider Notes (Signed)
History   Scribed for Benny Lennert, MD, the patient was seen in room APA16A/APA16A. This chart was scribed by Clarita Crane. This patient's care was started at 1:45PM.   CSN: [id number] Arrival date & time: 09/30/2010 12:46 PM  Chief Complaint  Patient presents with  . Fever   HPI [name] is a 77 y.o. female who presents to the Emergency Department complaining of fever. Per patient's at home caretaker, patient awoke with fever this morning and was not behaving as she normally does. Also notes associated weakness and urinary frequency last night and this morning. Patient's caretaker also noticed patient's right hand with significant swelling and redness. Patient with h/o renal disease, depression, dysphagia, diverticulosis, hypertension, dementia, tachycardia, stroke.   HPI ELEMENTS: Onset: this morning Duration: persistent since onset  Timing: constant  Context:  as above  Associated symptoms: +weakness, urinary frequency, swelling and redness to right hand.    PAST MEDICAL HISTORY:  Past Medical History  Diagnosis Date  . Renal disease   . Depression   . Dysphagia   . Diverticulosis   . Hypertension   . Dementia   . Tachycardia   . Stroke     PAST SURGICAL HISTORY:  Past Surgical History  Procedure Date  . Hemorroidectomy   . Ovarian cyst removal   . Total hip arthroplasty     MEDICATIONS:  Previous Medications   ALPRAZOLAM (XANAX) 0.5 MG TABLET    Take 0.5 mg by mouth 3 (three) times daily.     ASPIRIN EC 81 MG TABLET    Take 81 mg by mouth daily.     BISACODYL (DULCOLAX) 10 MG SUPPOSITORY    Place 10 mg rectally as needed. For constipation    FEEDING SUPPLEMENT (PRO-STAT SUGAR FREE 64) LIQD    Take 30 mLs by mouth 2 (two) times daily.     POLLEN EXTRACTS (PROSTAT PO)    Take 30 mLs by mouth 2 (two) times daily.     SENNA-DOCUSATE (SENOKOT-S) 8.6-50 MG PER TABLET    Take 1 tablet by mouth 2 (two) times daily.     SERTRALINE (ZOLOFT) 100 MG TABLET    Take  100 mg by mouth daily.     SERTRALINE (ZOLOFT) 50 MG TABLET    Take 50 mg by mouth daily.       ALLERGIES:  Allergies as of 09/30/2010 - Review Complete 09/30/2010  Allergen Reaction Noted  . Strawberry Other (See Comments) 09/30/2010     FAMILY HISTORY:  History reviewed. No pertinent family history.   SOCIAL HISTORY: History   Social History  . Marital Status: Married    Spouse Name: N/A    Number of Children: N/A  . Years of Education: N/A   Social History Main Topics  . Smoking status: Never Smoker   . Smokeless tobacco: None  . Alcohol Use: No  . Drug Use: No  . Sexually Active: No   Other Topics Concern  . None   Social History Narrative  . None    OB History    Grav Para Term Preterm Abortions TAB SAB Ect Mult Living   1 1 1       1       Review of Systems  Constitutional: Positive for fever. Negative for fatigue.  HENT: Negative for congestion, sinus pressure and ear discharge.   Eyes: Negative for discharge.  Respiratory: Negative for cough.   Cardiovascular: Negative for chest pain.  Gastrointestinal: Negative for abdominal pain and  diarrhea.  Genitourinary: Negative for frequency and hematuria.  Musculoskeletal: Negative for back pain.       Swelling to right hand.   Skin: Positive for rash.  Neurological: Negative for dizziness, seizures and headaches.       Change in behavior.   Hematological: Negative.   Psychiatric/Behavioral: Negative for hallucinations.    Physical Exam  BP 121/62  Pulse 87  Temp(Src) 102.3 F (39.1 C) (Rectal)  Resp 20  Ht 5\' 7"  (1.702 m)  Wt 105 lb (47.628 kg)  BMI 16.45 kg/m2  SpO2 98%  Physical Exam  Nursing note and vitals reviewed. Constitutional: She appears well-developed.       Cachectic  HENT:  Head: Normocephalic and atraumatic.  Eyes: EOM are normal. No scleral icterus.  Neck: Neck supple. No thyromegaly present.  Cardiovascular: Normal rate and regular rhythm.  Exam reveals no gallop and no  friction rub.   No murmur heard.      Radial pulses intact.   Pulmonary/Chest: No stridor. She has no wheezes. She has no rales. She exhibits no tenderness.       Crackles in bilateral lung fields.   Abdominal: Soft. She exhibits no distension. There is no tenderness.  Musculoskeletal: Normal range of motion. She exhibits no edema.  Lymphadenopathy:    She has no cervical adenopathy.  Neurological: She is alert. Coordination normal.  Skin: Skin is warm and dry.       Posterior aspect of right wrist and hand with significant swelling and redness. Skin with increased warmth to touch.   Psychiatric: She has a normal mood and affect. Her behavior is normal.    ED Course  Procedures  OTHER DATA REVIEWED: Nursing notes, vital signs, and past medical records reviewed. Lab results reviewed and considered Imaging results reviewed and considered  DIAGNOSTIC STUDIES: Oxygen Saturation is 99% on room air, normal by my interpretation.    LABS / RADIOLOGY: Results for orders placed during the hospital encounter of 09/30/10  CBC      Component Value Range   WBC 13.0 (*) 4.0 - 10.5 (K/uL)   RBC 4.45  3.87 - 5.11 (MIL/uL)   Hemoglobin 12.7  12.0 - 15.0 (g/dL)   HCT 16.1  09.6 - 04.5 (%)   MCV 87.4  78.0 - 100.0 (fL)   MCH 28.5  26.0 - 34.0 (pg)   MCHC 32.6  30.0 - 36.0 (g/dL)   RDW 40.9  81.1 - 91.4 (%)   Platelets 178  150 - 400 (K/uL)  DIFFERENTIAL      Component Value Range   Neutrophils Relative 74  43 - 77 (%)   Neutro Abs 9.7 (*) 1.7 - 7.7 (K/uL)   Lymphocytes Relative 16  12 - 46 (%)   Lymphs Abs 2.0  0.7 - 4.0 (K/uL)   Monocytes Relative 10  3 - 12 (%)   Monocytes Absolute 1.3 (*) 0.1 - 1.0 (K/uL)   Eosinophils Relative 0  0 - 5 (%)   Eosinophils Absolute 0.0  0.0 - 0.7 (K/uL)   Basophils Relative 0  0 - 1 (%)   Basophils Absolute 0.0  0.0 - 0.1 (K/uL)  COMPREHENSIVE METABOLIC PANEL      Component Value Range   Sodium 136  135 - 145 (mEq/L)   Potassium 4.3  3.5 - 5.1  (mEq/L)   Chloride 98  96 - 112 (mEq/L)   CO2 26  19 - 32 (mEq/L)   Glucose, Bld 120 (*) 70 - 99 (  mg/dL)   BUN 22  6 - 23 (mg/dL)   Creatinine, Ser 4.09  0.50 - 1.10 (mg/dL)   Calcium 9.3  8.4 - 81.1 (mg/dL)   Total Protein 7.4  6.0 - 8.3 (g/dL)   Albumin 3.7  3.5 - 5.2 (g/dL)   AST 23  0 - 37 (U/L)   ALT 15  0 - 35 (U/L)   Alkaline Phosphatase 72  39 - 117 (U/L)   Total Bilirubin 0.5  0.3 - 1.2 (mg/dL)   GFR calc non Af Amer >60  >60 (mL/min)   GFR calc Af Amer >60  >60 (mL/min)  URINALYSIS, ROUTINE W REFLEX MICROSCOPIC      Component Value Range   Color, Urine YELLOW  YELLOW    Appearance CLEAR  CLEAR    Specific Gravity, Urine 1.020  1.005 - 1.030    pH 6.5  5.0 - 8.0    Glucose, UA NEGATIVE  NEGATIVE (mg/dL)   Hgb urine dipstick LARGE (*) NEGATIVE    Bilirubin Urine NEGATIVE  NEGATIVE    Ketones, ur NEGATIVE  NEGATIVE (mg/dL)   Protein, ur TRACE (*) NEGATIVE (mg/dL)   Urobilinogen, UA 0.2  0.0 - 1.0 (mg/dL)   Nitrite NEGATIVE  NEGATIVE    Leukocytes, UA NEGATIVE  NEGATIVE   URINE MICROSCOPIC-ADD ON      Component Value Range   Squamous Epithelial / LPF FEW (*) RARE    WBC, UA 0-2  <3 (WBC/hpf)   RBC / HPF 11-20  <3 (RBC/hpf)   Bacteria, UA RARE  RARE     Dg Chest 1 View  09/30/2010  *RADIOLOGY REPORT*  Clinical Data: Chest pain, altered mental status  CHEST - 1 VIEW  Comparison: 07/21/2010  Findings: Upper normal heart size. Minimal elongation and calcification of thoracic aorta. Pulmonary vascularity normal. Emphysematous changes with bilateral upper lobe scarring and volume loss. No pulmonary infiltrate, pleural effusion or pneumothorax. Bones diffusely demineralized. Mild broad-based levoconvex thoracic scoliosis.  IMPRESSION: Emphysematous changes with bilateral upper lobe scarring and volume loss. No acute abnormalities.  Original Report Authenticated By: Lollie Marrow, M.D.   Dg Hand 2 View Right  09/30/2010  *RADIOLOGY REPORT*  Clinical Data: Hand pain, altered  mental status  RIGHT HAND - 2 VIEW  Comparison: None  Findings: Fingers superimposed on lateral view, due to patient condition, limiting assessment.  Severe osseous demineralization. Diffuse degenerative changes, joint space narrowing, and spur formation at multiple interphalangeal joints. Associated subluxations at the PIP joints of the middle and index fingers. Scattered intercarpal degenerative changes as well. No significant narrowing noted at the MCP joints. Minimal chondrocalcinosis at wrist. Calcified soft tissue nodule adjacent to the head of the proximal phalanx ring finger. No acute fracture, dislocation or bone destruction.  IMPRESSION: Severe osseous demineralization. Significant degenerative changes at interphalangeal joints and to a lesser degree intercarpal and MCP joints. No definite acute bony findings. Chondrocalcinosis at wrist, question CPPD/pseudogout.  Original Report Authenticated By: Lollie Marrow, M.D.    ED COURSE / COORDINATION OF CARE: Orders Placed This Encounter  Procedures  . DG Hand 2 View Right  . DG Chest 1 View  . CBC  . Differential  . Comprehensive metabolic panel  . Urinalysis, Routine w reflex microscopic  . Urine microscopic-add on  . Diet regular  . Send any printed patient health information to unit with patient  . Page Admitting Doctor upon patients arrival to unit/floor  . Vital signs  . Bed rest  . Bed  Request ED to IP  3:39Pm- Patient condition significantly improved. Patient now able to speak in full sentences although she mumbles. Will admit patient to hospital. 3:45PM-Consult complete with internal medicine. Patient case explained and discussed. Hospitalist agrees to admit patient.   WUJ:WJXBJ, hand infection,  Arthritic exacerbation   PLAN: Admit  CONDITION ON DISCHARGE: Improved.    MEDICATIONS GIVEN IN THE E.D.  Medications  ALPRAZolam (XANAX) 0.5 MG tablet (not administered)  Pollen Extracts (PROSTAT PO) (not administered)    sertraline (ZOLOFT) 50 MG tablet (not administered)  senna-docusate (SENOKOT-S) 8.6-50 MG per tablet (not administered)  aspirin EC 81 MG tablet (not administered)  sertraline (ZOLOFT) 100 MG tablet (not administered)  bisacodyl (DULCOLAX) 10 MG suppository (not administered)  feeding supplement (PRO-STAT SUGAR FREE 64) LIQD (not administered)  vancomycin (VANCOCIN) IVPB 1000 mg/200 mL premix (not administered)  cefTRIAXone (ROCEPHIN) 1 g in dextrose 5 % 50 mL IVPB (not administered)  0.9 %  sodium chloride infusion (not administered)       The chart was scribed for me under my direct supervision.  I personally performed the history, physical, and medical decision making and all procedures in the evaluation of this patient.Benny Lennert, MD 09/30/10 1600

## 2010-09-30 NOTE — ED Notes (Signed)
[name]- [id number] (Pt Caregiver)

## 2010-09-30 NOTE — ED Notes (Signed)
[facility]- [phone] (TRANSPORTATION USED BY PT)

## 2010-09-30 NOTE — ED Notes (Signed)
Per pt caretaker- pt has had fever since yesterday and increased confusion throughout the night. Pt also c/o r wrist swelling/redness.

## 2010-10-01 DIAGNOSIS — D649 Anemia, unspecified: Secondary | ICD-10-CM | POA: Diagnosis not present

## 2010-10-01 LAB — COMPREHENSIVE METABOLIC PANEL
ALT: 9 U/L (ref 0–35)
Albumin: 2.8 g/dL — ABNORMAL LOW (ref 3.5–5.2)
Alkaline Phosphatase: 52 U/L (ref 39–117)
Calcium: 8.6 mg/dL (ref 8.4–10.5)
Potassium: 3.8 mEq/L (ref 3.5–5.1)
Sodium: 138 mEq/L (ref 135–145)
Total Protein: 6.2 g/dL (ref 6.0–8.3)

## 2010-10-01 LAB — CBC
HCT: 33.2 % — ABNORMAL LOW (ref 36.0–46.0)
Hemoglobin: 11.2 g/dL — ABNORMAL LOW (ref 12.0–15.0)
MCHC: 33.7 g/dL (ref 30.0–36.0)
MCV: 86.5 fL (ref 78.0–100.0)
RDW: 14.5 % (ref 11.5–15.5)

## 2010-10-01 NOTE — Progress Notes (Signed)
Subjective: The patient is lying in bed. She ate most of her breakfast this morning. She is chronically confused.  Objective: Vital signs in last 24 hours: Filed Vitals:   09/30/10 1818 09/30/10 2207 10/01/10 0556 10/01/10 1506  BP: 145/78 115/62 113/56 106/60  Pulse: 88 88 67 79  Temp: 97.8 F (36.6 C) 98.4 F (36.9 C) 97.7 F (36.5 C) 97.3 F (36.3 C)  TempSrc: Oral Oral Oral Oral  Resp: 20 24 20 19   Height: 5\' 6"  (1.676 m)     Weight:      SpO2: 99% 96% 95% 97%    Intake/Output Summary (Last 24 hours) at 10/01/10 1639 Last data filed at 10/01/10 1250  Gross per 24 hour  Intake    440 ml  Output   1425 ml  Net   -985 ml    Weight change:   General appearance: alert and no distress Resp: clear to auscultation bilaterally Cardio: S1-S2 with a soft systolic murmur. GI: soft, non-tender; bowel sounds normal; no masses,  no organomegaly Extremities: Right hand dorsal surface reveals less erythema, less edema, and less warmth appear Neurologic: She is alert. She follows small commands. Her speech is slightly clearer today.  Lab Results: Basic Metabolic Panel:  Basename 10/01/10 0536 09/30/10 1351  NA 138 136  K 3.8 4.3  CL 105 98  CO2 26 26  GLUCOSE 90 120*  BUN 18 22  CREATININE 0.86 0.85  CALCIUM 8.6 9.3  MG -- --  PHOS -- --   Liver Function Tests:  Basename 10/01/10 0536 09/30/10 1351  AST 15 23  ALT 9 15  ALKPHOS 52 72  BILITOT 0.4 0.5  PROT 6.2 7.4  ALBUMIN 2.8* 3.7   No results found for this basename: LIPASE:2,AMYLASE:2 in the last 72 hours CBC:  Basename 10/01/10 0536 09/30/10 1351  WBC 7.7 13.0*  NEUTROABS -- 9.7*  HGB 11.2* 12.7  HCT 33.2* 38.9  MCV 86.5 87.4  PLT 150 178   Cardiac Enzymes: No results found for this basename: CKTOTAL:3,CKMB:3,CKMBINDEX:3,TROPONINI:3 in the last 72 hours BNP: No results found for this basename: POCBNP:3 in the last 72 hours D-Dimer: No results found for this basename: DDIMER:2 in the last 72  hours CBG: No results found for this basename: GLUCAP:6 in the last 72 hours Hemoglobin A1C: No results found for this basename: HGBA1C in the last 72 hours Fasting Lipid Panel: No results found for this basename: CHOL,HDL,LDLCALC,TRIG,CHOLHDL,LDLDIRECT in the last 72 hours Thyroid Function Tests:  Basename 10/01/10 0536  TSH 3.643  T4TOTAL --  FREET4 1.12  T3FREE --  THYROIDAB --   Anemia Panel: No results found for this basename: VITAMINB12,FOLATE,FERRITIN,TIBC,IRON,RETICCTPCT in the last 72 hours    Micro: Recent Results (from the past 240 hour(s))  CULTURE, BLOOD (ROUTINE X 2)     Status: Normal (Preliminary result)   Collection Time   09/30/10  1:10 PM      Component Value Range Status Comment   Specimen Description BLOOD LEFT WRIST   Final    Special Requests     Final    Value: BOTTLES DRAWN AEROBIC AND ANAEROBIC AEB=10CC ANA=5CC   Culture NO GROWTH 1 DAY   Final    Report Status PENDING   Incomplete   CULTURE, BLOOD (ROUTINE X 2)     Status: Normal (Preliminary result)   Collection Time   09/30/10  6:24 PM      Component Value Range Status Comment   Specimen Description LEFT ANTECUBITAL  Final    Special Requests BOTTLES DRAWN AEROBIC AND ANAEROBIC 5CC EACH   Final    Culture NO GROWTH 1 DAY   Final    Report Status PENDING   Incomplete     Studies/Results: Dg Chest 1 View  09/30/2010  *RADIOLOGY REPORT*  Clinical Data: Chest pain, altered mental status  CHEST - 1 VIEW  Comparison: 07/21/2010  Findings: Upper normal heart size. Minimal elongation and calcification of thoracic aorta. Pulmonary vascularity normal. Emphysematous changes with bilateral upper lobe scarring and volume loss. No pulmonary infiltrate, pleural effusion or pneumothorax. Bones diffusely demineralized. Mild broad-based levoconvex thoracic scoliosis.  IMPRESSION: Emphysematous changes with bilateral upper lobe scarring and volume loss. No acute abnormalities.  Original Report Authenticated By:  Lollie Marrow, M.D.   Dg Hand 2 View Right  09/30/2010  *RADIOLOGY REPORT*  Clinical Data: Hand pain, altered mental status  RIGHT HAND - 2 VIEW  Comparison: None  Findings: Fingers superimposed on lateral view, due to patient condition, limiting assessment.  Severe osseous demineralization. Diffuse degenerative changes, joint space narrowing, and spur formation at multiple interphalangeal joints. Associated subluxations at the PIP joints of the middle and index fingers. Scattered intercarpal degenerative changes as well. No significant narrowing noted at the MCP joints. Minimal chondrocalcinosis at wrist. Calcified soft tissue nodule adjacent to the head of the proximal phalanx ring finger. No acute fracture, dislocation or bone destruction.  IMPRESSION: Severe osseous demineralization. Significant degenerative changes at interphalangeal joints and to a lesser degree intercarpal and MCP joints. No definite acute bony findings. Chondrocalcinosis at wrist, question CPPD/pseudogout.  Original Report Authenticated By: Lollie Marrow, M.D.    Medications: I have reviewed the patient's current medications.  Assessment: 1. Cellulitis and possibly pseudogout of the right hand dorsal surface. She is being treated with Rocephin and vancomycin. She is being given twice a day dosing of ibuprofen for 3 days. The extent of the erythema and edema has subsided.  2. Anemia. Likely dilutional from the IV fluids. 3.Chronic dementia. This is stable. 4. Radiographic findings of emphysema. Her lungs are mostly clear on exam. 5. Microscopic hematuria. Likely secondary to the Foley catheter. 6.Depression with anxiety. Stable.       Plan: 1. Continue current management. Discontinue Foley catheter in the morning.   LOS: 1 day   Makinzi Prieur 10/01/2010, 4:39 PM

## 2010-10-02 MED ORDER — ENOXAPARIN SODIUM 40 MG/0.4ML ~~LOC~~ SOLN
40.0000 mg | Freq: Every day | SUBCUTANEOUS | Status: DC
  Administered 2010-10-02 – 2010-10-03 (×2): 40 mg via SUBCUTANEOUS
  Filled 2010-10-02 (×2): qty 0.4

## 2010-10-02 MED ORDER — SODIUM CHLORIDE 0.9 % IJ SOLN
INTRAMUSCULAR | Status: AC
  Administered 2010-10-02: 17:00:00
  Filled 2010-10-02: qty 10

## 2010-10-02 MED ORDER — VANCOMYCIN HCL 1000 MG IV SOLR
750.0000 mg | INTRAVENOUS | Status: DC
  Administered 2010-10-02: 750 mg via INTRAVENOUS
  Filled 2010-10-02 (×2): qty 750

## 2010-10-02 NOTE — Progress Notes (Signed)
ANTIBIOTIC CONSULT NOTE - INITIAL  Pharmacy Consult for Vancomycin Indication: Cellulitis  Patient Measurements: Height: 5\' 6"  (167.6 cm) Weight: 105 lb (47.628 kg) IBW/kg (Calculated) : 59.3  Adjusted Body Weight: N/a  Vital Signs: Temp: 98.1 F (36.7 C) (08/25 1449) Temp src: Oral (08/25 1449) BP: 121/68 mmHg (08/25 1449) Pulse Rate: 107  (08/25 1449)    Labs:  Basename 10/01/10 0536 09/30/10 1351  WBC 7.7 13.0*  HGB 11.2* 12.7  PLT 150 178  LABCREA -- --  CREATININE 0.86 0.85  CRCLEARANCE -- --      Microbiology: Recent Results (from the past 720 hour(s))  CULTURE, BLOOD (ROUTINE X 2)     Status: Normal (Preliminary result)   Collection Time   09/30/10  1:10 PM      Component Value Range Status Comment   Specimen Description BLOOD LEFT WRIST   Final    Special Requests     Final    Value: BOTTLES DRAWN AEROBIC AND ANAEROBIC AEB=10CC ANA=5CC   Culture NO GROWTH 2 DAYS   Final    Report Status PENDING   Incomplete   CULTURE, BLOOD (ROUTINE X 2)     Status: Normal (Preliminary result)   Collection Time   09/30/10  6:24 PM      Component Value Range Status Comment   Specimen Description LEFT ANTECUBITAL   Final    Special Requests BOTTLES DRAWN AEROBIC AND ANAEROBIC 5CC EACH   Final    Culture NO GROWTH 2 DAYS   Final    Report Status PENDING   Incomplete     Medical History: Past Medical History  Diagnosis Date  . History of DVT (deep vein thrombosis)     Old LE DVT per doppler 4/12  . Depression   . Dysphagia     Schatzki ring dilatation 07/2006  . Diverticulosis   . Hypertension   . Dementia   . Tachycardia   . Stroke   . Rhabdomyolysis     05/2010 after being found down at home.  . ARF (acute renal failure)     05/2010. Resolved.  Marland Kitchen PNA (pneumonia)   . CKD (chronic kidney disease), stage II   . Hemorrhoids   . UTI (lower urinary tract infection)   . Urinary retention     Hx of in 05/2010.  Marland Kitchen Near syncope   . Constipation   . Depression with  anxiety 09/30/2010    Medications:   Reviewed.  Assessment: Empiric therapy.  Goal of Therapy:  Vancomycin trough level 10-15 mcg/ml  Plan:  Measure antibiotic drug levels at steady state Follow up culture results Vancomycin 750mg  IV every 24 hours.  Tomi Bamberger J 10/02/2010,2:53 PM

## 2010-10-02 NOTE — Progress Notes (Signed)
Chart reviewed  Subjective: Unable  Objective: Vital signs in last 24 hours: Filed Vitals:   10/01/10 0556 10/01/10 1506 10/01/10 2035 10/02/10 0537  BP: 113/56 106/60 103/62 112/72  Pulse: 67 79 87 78  Temp: 97.7 F (36.5 C) 97.3 F (36.3 C) 97.6 F (36.4 C) 97.3 F (36.3 C)  TempSrc: Oral Oral Oral Axillary  Resp: 20 19 18 17   Height:      Weight:      SpO2: 95% 97% 96% 100%    Intake/Output Summary (Last 24 hours) at 10/02/10 1441 Last data filed at 10/02/10 0800  Gross per 24 hour  Intake    540 ml  Output      0 ml  Net    540 ml    Weight change:   General appearance: Asleep arousable Resp: clear to auscultation bilaterally Cardio: S1-S2 with a soft systolic murmur. GI: soft, non-tender; bowel sounds normal; no masses,  no organomegaly Extremities: Right wrist is slightly erythematous. Right arm is erythematous from the elbow to the wrist and swollen Neurologic: She is alert. She follows small commands. Her speech is slightly clearer today.  Lab Results: Basic Metabolic Panel:  Basename 10/01/10 0536 09/30/10 1351  NA 138 136  K 3.8 4.3  CL 105 98  CO2 26 26  GLUCOSE 90 120*  BUN 18 22  CREATININE 0.86 0.85  CALCIUM 8.6 9.3  MG -- --  PHOS -- --   Liver Function Tests:  Basename 10/01/10 0536 09/30/10 1351  AST 15 23  ALT 9 15  ALKPHOS 52 72  BILITOT 0.4 0.5  PROT 6.2 7.4  ALBUMIN 2.8* 3.7   No results found for this basename: LIPASE:2,AMYLASE:2 in the last 72 hours CBC:  Basename 10/01/10 0536 09/30/10 1351  WBC 7.7 13.0*  NEUTROABS -- 9.7*  HGB 11.2* 12.7  HCT 33.2* 38.9  MCV 86.5 87.4  PLT 150 178   Cardiac Enzymes: No results found for this basename: CKTOTAL:3,CKMB:3,CKMBINDEX:3,TROPONINI:3 in the last 72 hours BNP: No results found for this basename: POCBNP:3 in the last 72 hours D-Dimer: No results found for this basename: DDIMER:2 in the last 72 hours CBG: No results found for this basename: GLUCAP:6 in the last 72  hours Hemoglobin A1C: No results found for this basename: HGBA1C in the last 72 hours Fasting Lipid Panel: No results found for this basename: CHOL,HDL,LDLCALC,TRIG,CHOLHDL,LDLDIRECT in the last 72 hours Thyroid Function Tests:  Basename 10/01/10 0536  TSH 3.643  T4TOTAL --  FREET4 1.12  T3FREE --  THYROIDAB --   Anemia Panel: No results found for this basename: VITAMINB12,FOLATE,FERRITIN,TIBC,IRON,RETICCTPCT in the last 72 hours    Micro: Recent Results (from the past 240 hour(s))  CULTURE, BLOOD (ROUTINE X 2)     Status: Normal (Preliminary result)   Collection Time   09/30/10  1:10 PM      Component Value Range Status Comment   Specimen Description BLOOD LEFT WRIST   Final    Special Requests     Final    Value: BOTTLES DRAWN AEROBIC AND ANAEROBIC AEB=10CC ANA=5CC   Culture NO GROWTH 2 DAYS   Final    Report Status PENDING   Incomplete   CULTURE, BLOOD (ROUTINE X 2)     Status: Normal (Preliminary result)   Collection Time   09/30/10  6:24 PM      Component Value Range Status Comment   Specimen Description LEFT ANTECUBITAL   Final    Special Requests BOTTLES DRAWN AEROBIC AND ANAEROBIC  5CC EACH   Final    Culture NO GROWTH 2 DAYS   Final    Report Status PENDING   Incomplete     Medications: I have reviewed the patient's current medications.  Assessment: 1. Cellulitis. today, her right arm seems to be more involved than her hand. She has not received a dose of vancomycin since the 23rd. I will reorder the vancomycin and stop the Rocephin. Keep the arm elevated. She is being given twice a day dosing of ibuprofen for 3 days.   2. Anemia. Likely dilutional from the IV fluids.  3.Chronic dementia. This is stable.  4. Radiographic findings of emphysema. Her lungs are mostly clear on exam.  5. Microscopic hematuria. Likely secondary to the Foley catheter.  6.Depression with anxiety. Stable.  DNR   LOS: 2 days   Chere Babson L 10/02/2010, 2:41 PM

## 2010-10-03 MED ORDER — DOXYCYCLINE HYCLATE 100 MG PO TABS: 100.0000 mg | ORAL_TABLET | Freq: Once | ORAL | Status: DC

## 2010-10-03 MED ORDER — DOXYCYCLINE HYCLATE 50 MG PO CAPS: 100.0000 mg | ORAL_CAPSULE | Freq: Two times a day (BID) | ORAL | Status: AC

## 2010-10-03 NOTE — Discharge Summary (Signed)
Physician Discharge Summary  Patient ID: [name] MRN: [id number] DOB/AGE: [birth year] 77 y.o.  Admit date: 09/30/2010 Discharge date: 10/03/2010  Discharge Diagnoses:  Active Problems:  Cellulitis  Pseudogout  Hematuria, microscopic  Dementia  Emphysema  Depression with anxiety  Anemia   Current Discharge Medication List    START taking these medications   Details  doxycycline (VIBRAMYCIN) 50 MG capsule Take 2 capsules (100 mg total) by mouth 2 (two) times daily. Qty: 10 capsule, Refills: 0      CONTINUE these medications which have NOT CHANGED   Details  ALPRAZolam (XANAX) 0.5 MG tablet Take 0.5 mg by mouth 3 (three) times daily.      aspirin EC 81 MG tablet Take 81 mg by mouth daily.      feeding supplement (PRO-STAT SUGAR FREE 64) LIQD Take 30 mLs by mouth 2 (two) times daily.      senna-docusate (SENOKOT-S) 8.6-50 MG per tablet Take 1 tablet by mouth 2 (two) times daily.      sertraline (ZOLOFT) 50 MG tablet Take 50 mg by mouth daily.      bisacodyl (DULCOLAX) 10 MG suppository Place 10 mg rectally as needed. For constipation       STOP taking these medications     Pollen Extracts (PROSTAT PO)           Follow-up Information    Please follow up. (primary care physician if symptoms worsen)          Disposition: Intermediate Care Facility  Discharged Condition: Stable  Consults:   none  Labs:  No results found for this or any previous visit (from the past 48 hour(s)).  Diagnostics:  Dg Chest 1 View  09/30/2010  *RADIOLOGY REPORT*  Clinical Data: Chest pain, altered mental status  CHEST - 1 VIEW  Comparison: 07/21/2010  Findings: Upper normal heart size. Minimal elongation and calcification of thoracic aorta. Pulmonary vascularity normal. Emphysematous changes with bilateral upper lobe scarring and volume loss. No pulmonary infiltrate, pleural effusion or pneumothorax. Bones diffusely demineralized. Mild broad-based levoconvex thoracic  scoliosis.  IMPRESSION: Emphysematous changes with bilateral upper lobe scarring and volume loss. No acute abnormalities.  Original Report Authenticated By: Lollie Marrow, M.D.   Dg Hand 2 View Right  09/30/2010  *RADIOLOGY REPORT*  Clinical Data: Hand pain, altered mental status  RIGHT HAND - 2 VIEW  Comparison: None  Findings: Fingers superimposed on lateral view, due to patient condition, limiting assessment.  Severe osseous demineralization. Diffuse degenerative changes, joint space narrowing, and spur formation at multiple interphalangeal joints. Associated subluxations at the PIP joints of the middle and index fingers. Scattered intercarpal degenerative changes as well. No significant narrowing noted at the MCP joints. Minimal chondrocalcinosis at wrist. Calcified soft tissue nodule adjacent to the head of the proximal phalanx ring finger. No acute fracture, dislocation or bone destruction.  IMPRESSION: Severe osseous demineralization. Significant degenerative changes at interphalangeal joints and to a lesser degree intercarpal and MCP joints. No definite acute bony findings. Chondrocalcinosis at wrist, question CPPD/pseudogout.  Original Report Authenticated By: Lollie Marrow, M.D.   DNR   Hospital Course: Please see H&P for complete admission details. The patient is a demented 77 year old white female who presented with hand swelling. She had cellulitis and possibly pseudogout on x-ray. She was started on IV antibiotics. By the time of discharge, the cellulitis was nearly resolved. She will complete a five-day course of the doxycycline and can followup with her primary care physician if needed.  Discharge Exam: Blood pressure 140/71, pulse 75, temperature 97.8 F (36.6 C), temperature source Axillary, resp. rate 16, height 5\' 6"  (1.676 m), weight 47.628 kg (105 lb), SpO2 100.00%. More alert. Arm erythema nearly gone. Otherwise exam unchanged from 10/03/2010.   SignedChristiane Ha 10/03/2010, 2:05 PM

## 2010-10-05 LAB — CULTURE, BLOOD (ROUTINE X 2): Culture: NO GROWTH

## 2010-10-14 ENCOUNTER — Emergency Department (HOSPITAL_COMMUNITY)
Admission: EM | Admit: 2010-10-14 | Discharge: 2010-10-14 | Disposition: A | Discharge status: Home / Self Care | Payer: Medicare Other | Attending: Emergency Medicine | Admitting: Emergency Medicine

## 2010-10-14 ENCOUNTER — Encounter (HOSPITAL_COMMUNITY): Payer: Self-pay

## 2010-10-14 DIAGNOSIS — N189 Chronic kidney disease, unspecified: Secondary | ICD-10-CM | POA: Insufficient documentation

## 2010-10-14 DIAGNOSIS — Z86718 Personal history of other venous thrombosis and embolism: Secondary | ICD-10-CM | POA: Insufficient documentation

## 2010-10-14 DIAGNOSIS — I129 Hypertensive chronic kidney disease with stage 1 through stage 4 chronic kidney disease, or unspecified chronic kidney disease: Secondary | ICD-10-CM | POA: Insufficient documentation

## 2010-10-14 DIAGNOSIS — F341 Dysthymic disorder: Secondary | ICD-10-CM | POA: Insufficient documentation

## 2010-10-14 DIAGNOSIS — L539 Erythematous condition, unspecified: Secondary | ICD-10-CM | POA: Insufficient documentation

## 2010-10-14 DIAGNOSIS — L0390 Cellulitis, unspecified: Secondary | ICD-10-CM

## 2010-10-14 DIAGNOSIS — I10 Essential (primary) hypertension: Secondary | ICD-10-CM | POA: Insufficient documentation

## 2010-10-14 DIAGNOSIS — M79609 Pain in unspecified limb: Secondary | ICD-10-CM | POA: Insufficient documentation

## 2010-10-14 HISTORY — DX: Anxiety disorder, unspecified: F41.9

## 2010-10-14 HISTORY — DX: Cellulitis, unspecified: L03.90

## 2010-10-14 LAB — BASIC METABOLIC PANEL
BUN: 20 mg/dL (ref 6–23)
Chloride: 100 mEq/L (ref 96–112)
Creatinine, Ser: 0.86 mg/dL (ref 0.50–1.10)
Glucose, Bld: 182 mg/dL — ABNORMAL HIGH (ref 70–99)
Potassium: 3.5 mEq/L (ref 3.5–5.1)

## 2010-10-14 LAB — DIFFERENTIAL
Lymphs Abs: 1.7 10*3/uL (ref 0.7–4.0)
Monocytes Absolute: 0.9 10*3/uL (ref 0.1–1.0)
Monocytes Relative: 9 % (ref 3–12)
Neutro Abs: 7.5 10*3/uL (ref 1.7–7.7)
Neutrophils Relative %: 73 % (ref 43–77)

## 2010-10-14 LAB — CBC
HCT: 33.3 % — ABNORMAL LOW (ref 36.0–46.0)
Hemoglobin: 10.8 g/dL — ABNORMAL LOW (ref 12.0–15.0)
MCH: 28.1 pg (ref 26.0–34.0)
MCHC: 32.4 g/dL (ref 30.0–36.0)
RBC: 3.84 MIL/uL — ABNORMAL LOW (ref 3.87–5.11)

## 2010-10-14 MED ORDER — VANCOMYCIN HCL IN DEXTROSE 1-5 GM/200ML-% IV SOLN
1000.0000 mg | Freq: Once | INTRAVENOUS | Status: AC
  Administered 2010-10-14: 1000 mg via INTRAVENOUS
  Filled 2010-10-14: qty 200

## 2010-10-14 MED ORDER — DOXYCYCLINE HYCLATE 100 MG PO CAPS: 100.0000 mg | ORAL_CAPSULE | Freq: Two times a day (BID) | ORAL | Status: AC

## 2010-10-14 NOTE — ED Provider Notes (Signed)
Scribed for Dr. Bebe Shaggy, the patient was seen in room 04. This chart was scribed by Hillery Hunter. This patient's care was started at 18:02.   History    CSN: [id number] Arrival date & time: 10/14/2010  5:50 PM  Chief Complaint  Patient presents with  . Fever  . Arm Pain   The history is provided by a caregiver. The history is limited by the condition of the patient (dementia).    [name] is a 77 y.o. female who presents to the Emergency Department with caregiver who reports the patient has been complaining of redness to dorsum of right hand worse since last night with fever. cargiver denies trauma to the area and says the patient has change in behavior: "babbling all night." She has been seen for this and dx with cellulitis, rx abx with improvement, but now worse as of last night.   HPI ELEMENTS:  Location: dorsum of right hand Timing: worse since last night Quality: erythema, pain Modifying factors: worse with palpation Context: as above  Associated symptoms: fever, no trauma     Past Medical History  Diagnosis Date  . History of DVT (deep vein thrombosis)     Old LE DVT per doppler 4/12  . Depression   . Dysphagia     Schatzki ring dilatation 07/2006  . Diverticulosis   . Hypertension   . Dementia   . Tachycardia   . Stroke   . Rhabdomyolysis     05/2010 after being found down at home.  . ARF (acute renal failure)     05/2010. Resolved.  Marland Kitchen PNA (pneumonia)   . CKD (chronic kidney disease), stage II   . Hemorrhoids   . UTI (lower urinary tract infection)   . Urinary retention     Hx of in 05/2010.  Marland Kitchen Near syncope   . Constipation   . Depression with anxiety 09/30/2010  . Cellulitis   . Hematuria   . Anxiety     Past Surgical History  Procedure Date  . Hemorroidectomy   . Ovarian cyst removal   . Total hip arthroplasty   . Lumbar back surgery     2004.    No family history on file.  History  Substance Use Topics  . Smoking status:  Never Smoker   . Smokeless tobacco: Not on file  . Alcohol Use: No    OB History    Grav Para Term Preterm Abortions TAB SAB Ect Mult Living   1 1 1       1       Review of Systems  Unable to perform ROS secondary to patient dementia. See HPI for review of systems obtained by carergiver  Physical Exam  BP 132/56  Pulse 111  Temp(Src) 99.9 F (37.7 C) (Oral)  Resp 20  Ht 5\' 6"  (1.676 m)  Wt 109 lb (49.442 kg)  BMI 17.59 kg/m2  SpO2 98%  Physical Exam  Nursing note and vitals reviewed.  CONSTITUTIONAL: appears elderly and frail HEAD AND FACE: Normocephalic/atraumatic EYES: EOMI/PERRL ENMT: Mucous membranes moist NECK: supple no meningeal signs SPINE: entire spine nontender CV: S1/S2 noted, no murmurs/rubs/gallops noted LUNGS: Lungs are clear to auscultation bilaterally, no apparent distress ABDOMEN: soft, nontender, no rebound or guarding NEURO: Pt is awake/alert, moves all extremities symmetrically, demented at baseline EXTREMITIES: pulses normal, erythema over hand and wrist extending into distal forearm SKIN: warm, color normal PSYCH: no abnormalities of mood noted   ED Course  Procedures  OTHER DATA  REVIEWED: Nursing notes, vital signs, and past medical records reviewed.  DIAGNOSTIC STUDIES: Oxygen Saturation is 98% on room air, normal by my interpretation.    LABS / RADIOLOGY: Results for orders placed during the hospital encounter of 10/14/10  CBC      Component Value Range   WBC 10.3  4.0 - 10.5 (K/uL)   RBC 3.84 (*) 3.87 - 5.11 (MIL/uL)   Hemoglobin 10.8 (*) 12.0 - 15.0 (g/dL)   HCT 16.1 (*) 09.6 - 46.0 (%)   MCV 86.7  78.0 - 100.0 (fL)   MCH 28.1  26.0 - 34.0 (pg)   MCHC 32.4  30.0 - 36.0 (g/dL)   RDW 04.5  40.9 - 81.1 (%)   Platelets 196  150 - 400 (K/uL)  DIFFERENTIAL      Component Value Range   Neutrophils Relative 73  43 - 77 (%)   Neutro Abs 7.5  1.7 - 7.7 (K/uL)   Lymphocytes Relative 17  12 - 46 (%)   Lymphs Abs 1.7  0.7 - 4.0  (K/uL)   Monocytes Relative 9  3 - 12 (%)   Monocytes Absolute 0.9  0.1 - 1.0 (K/uL)   Eosinophils Relative 1  0 - 5 (%)   Eosinophils Absolute 0.1  0.0 - 0.7 (K/uL)   Basophils Relative 0  0 - 1 (%)   Basophils Absolute 0.0  0.0 - 0.1 (K/uL)  BASIC METABOLIC PANEL      Component Value Range   Sodium 137  135 - 145 (mEq/L)   Potassium 3.5  3.5 - 5.1 (mEq/L)   Chloride 100  96 - 112 (mEq/L)   CO2 26  19 - 32 (mEq/L)   Glucose, Bld 182 (*) 70 - 99 (mg/dL)   BUN 20  6 - 23 (mg/dL)   Creatinine, Ser 9.14  0.50 - 1.10 (mg/dL)   Calcium 8.5  8.4 - 78.2 (mg/dL)   GFR calc non Af Amer >60  >60 (mL/min)   GFR calc Af Amer >60  >60 (mL/min)     ED COURSE / COORDINATION OF CARE: 18:20. Initial orders: CBC, differential, BMP. 18:30. Vancomycin 1000mg /271mL premix   MDM: pt admitted recently for similar findings, at that time felt it could be pseudogout given location in hand/wrist.  Given history/exam, could be early cellulitis, but pt is stable, nontoxic in appearance and appropriate for close outpatient treatment monitoring.  D/w caregiver initially and amenable to plan Pt stable, no other source of infection found, at her baseline  IMPRESSION: Diagnoses that have been ruled out:  Diagnoses that are still under consideration:  Final diagnoses:  Cellulitis    MEDICATIONS GIVEN IN THE E.D.  Medications  vancomycin (VANCOCIN) IVPB 1000 mg/200 mL premix (0 mg Intravenous Stopped 10/14/10 2031)    DISCHARGE MEDICATIONS: New Prescriptions   DOXYCYCLINE (VIBRAMYCIN) 100 MG CAPSULE    Take 1 capsule (100 mg total) by mouth 2 (two) times daily.    SCRIBE ATTESTATION: I personally performed the services described in this documentation, which was scribed in my presence. The recorded information has been reviewed and considered.      Joya Gaskins, MD 10/14/10 2103

## 2010-10-14 NOTE — ED Notes (Signed)
Caregiver Harriett Sine called. Awaiting her arrival to d/c pt

## 2010-10-14 NOTE — ED Notes (Signed)
Pt's caregiver said pt was admitted for cellulitis in r arm approx 3 weeks ago.  Pt c/o pain to right hand and wrist area x 3 days.  Caregiver says noticed pt had fever this am.  Says fever was 101.2

## 2010-10-14 NOTE — ED Notes (Signed)
Pt has slightly red area to top of right hand,

## 2010-10-18 NOTE — Progress Notes (Signed)
Encounter addended by: Ann Marie M Medley on: 10/18/2010 11:17 AM<BR>     Documentation filed: Flowsheet VN

## 2010-11-12 LAB — BASIC METABOLIC PANEL
BUN: 16 mg/dL (ref 6–23)
CO2: 28 mEq/L (ref 19–32)
Chloride: 103 mEq/L (ref 96–112)
Creatinine, Ser: 1.03 mg/dL (ref 0.4–1.2)
Glucose, Bld: 89 mg/dL (ref 70–99)
Potassium: 3.7 mEq/L (ref 3.5–5.1)

## 2010-11-12 LAB — DIFFERENTIAL
Basophils Relative: 1 % (ref 0–1)
Eosinophils Absolute: 0.2 10*3/uL (ref 0.0–0.7)
Eosinophils Relative: 2 % (ref 0–5)
Lymphs Abs: 1.5 10*3/uL (ref 0.7–4.0)

## 2010-11-12 LAB — CBC
HCT: 38.9 % (ref 36.0–46.0)
MCHC: 33.4 g/dL (ref 30.0–36.0)
MCV: 92.9 fL (ref 78.0–100.0)
Platelets: 177 10*3/uL (ref 150–400)
RDW: 12.7 % (ref 11.5–15.5)
WBC: 6.4 10*3/uL (ref 4.0–10.5)

## 2010-11-12 LAB — TYPE AND SCREEN
ABO/RH(D): A POS
Antibody Screen: NEGATIVE

## 2010-11-12 LAB — URINE MICROSCOPIC-ADD ON

## 2010-11-12 LAB — URINALYSIS, ROUTINE W REFLEX MICROSCOPIC
Bilirubin Urine: NEGATIVE
Glucose, UA: NEGATIVE mg/dL
Protein, ur: NEGATIVE mg/dL

## 2010-11-12 LAB — PROTIME-INR: Prothrombin Time: 13.6 seconds (ref 11.6–15.2)

## 2010-11-24 LAB — CBC
MCHC: 35.5
RDW: 13.4

## 2010-11-24 LAB — DIFFERENTIAL
Basophils Absolute: 0
Basophils Relative: 1
Eosinophils Absolute: 0.2
Eosinophils Relative: 4
Lymphocytes Relative: 28
Lymphs Abs: 1.4
Monocytes Absolute: 0.4
Monocytes Relative: 8
Neutro Abs: 3
Neutrophils Relative %: 59

## 2010-12-02 ENCOUNTER — Inpatient Hospital Stay (HOSPITAL_COMMUNITY)
Admission: EM | Admit: 2010-12-02 | Discharge: 2010-12-06 | DRG: 690 | Disposition: A | Discharge status: Home Health | Payer: Medicare Other | Attending: Internal Medicine | Admitting: Internal Medicine

## 2010-12-02 ENCOUNTER — Encounter (HOSPITAL_COMMUNITY): Payer: Self-pay | Admitting: Emergency Medicine

## 2010-12-02 ENCOUNTER — Emergency Department (HOSPITAL_COMMUNITY): Payer: Medicare Other

## 2010-12-02 DIAGNOSIS — N390 Urinary tract infection, site not specified: Principal | ICD-10-CM | POA: Diagnosis present

## 2010-12-02 DIAGNOSIS — R509 Fever, unspecified: Secondary | ICD-10-CM

## 2010-12-02 DIAGNOSIS — I129 Hypertensive chronic kidney disease with stage 1 through stage 4 chronic kidney disease, or unspecified chronic kidney disease: Secondary | ICD-10-CM | POA: Diagnosis present

## 2010-12-02 DIAGNOSIS — R339 Retention of urine, unspecified: Secondary | ICD-10-CM | POA: Diagnosis present

## 2010-12-02 DIAGNOSIS — Z66 Do not resuscitate: Secondary | ICD-10-CM | POA: Diagnosis present

## 2010-12-02 DIAGNOSIS — F0390 Unspecified dementia without behavioral disturbance: Secondary | ICD-10-CM | POA: Diagnosis present

## 2010-12-02 DIAGNOSIS — N182 Chronic kidney disease, stage 2 (mild): Secondary | ICD-10-CM | POA: Diagnosis present

## 2010-12-02 DIAGNOSIS — F341 Dysthymic disorder: Secondary | ICD-10-CM | POA: Diagnosis present

## 2010-12-02 LAB — URINALYSIS, ROUTINE W REFLEX MICROSCOPIC
Glucose, UA: NEGATIVE mg/dL
Nitrite: NEGATIVE
Protein, ur: NEGATIVE mg/dL
pH: 5.5 (ref 5.0–8.0)

## 2010-12-02 LAB — LACTIC ACID, PLASMA: Lactic Acid, Venous: 0.8 mmol/L (ref 0.5–2.2)

## 2010-12-02 LAB — CBC
HCT: 33.4 % — ABNORMAL LOW (ref 36.0–46.0)
MCH: 28.4 pg (ref 26.0–34.0)
MCV: 86.3 fL (ref 78.0–100.0)
Platelets: 181 10*3/uL (ref 150–400)
RBC: 3.87 MIL/uL (ref 3.87–5.11)
WBC: 12.6 10*3/uL — ABNORMAL HIGH (ref 4.0–10.5)

## 2010-12-02 LAB — URINE MICROSCOPIC-ADD ON

## 2010-12-02 LAB — DIFFERENTIAL
Basophils Relative: 0 % (ref 0–1)
Eosinophils Relative: 1 % (ref 0–5)
Lymphs Abs: 1.6 10*3/uL (ref 0.7–4.0)
Monocytes Absolute: 0.9 10*3/uL (ref 0.1–1.0)
Monocytes Relative: 7 % (ref 3–12)
Neutro Abs: 10 10*3/uL — ABNORMAL HIGH (ref 1.7–7.7)

## 2010-12-02 LAB — LIPASE, BLOOD: Lipase: 61 U/L — ABNORMAL HIGH (ref 11–59)

## 2010-12-02 LAB — COMPREHENSIVE METABOLIC PANEL
ALT: 11 U/L (ref 0–35)
BUN: 24 mg/dL — ABNORMAL HIGH (ref 6–23)
CO2: 24 mEq/L (ref 19–32)
Calcium: 8.7 mg/dL (ref 8.4–10.5)
Creatinine, Ser: 0.98 mg/dL (ref 0.50–1.10)
GFR calc Af Amer: 63 mL/min — ABNORMAL LOW (ref >90)
GFR calc non Af Amer: 54 mL/min — ABNORMAL LOW (ref >90)
Glucose, Bld: 129 mg/dL — ABNORMAL HIGH (ref 70–99)
Sodium: 135 mEq/L (ref 135–145)
Total Protein: 6.7 g/dL (ref 6.0–8.3)

## 2010-12-02 MED ORDER — SODIUM CHLORIDE 0.9 % IV BOLUS (SEPSIS)
500.0000 mL | Freq: Once | INTRAVENOUS | Status: AC
  Administered 2010-12-02: 500 mL via INTRAVENOUS

## 2010-12-02 MED ORDER — SODIUM CHLORIDE 0.9 % IV SOLN: INTRAVENOUS | Status: DC

## 2010-12-02 MED ORDER — DEXTROSE 5 % IV SOLN
1.0000 g | Freq: Once | INTRAVENOUS | Status: DC
  Filled 2010-12-02: qty 1

## 2010-12-02 NOTE — ED Notes (Signed)
Pt caregiver states pt has not urinated since last night. Caregiver states pt's abd is tight which is not normal.

## 2010-12-02 NOTE — ED Provider Notes (Signed)
History    Scribed for Michele Bonier, MD, the patient was seen in room [facility]18/[facility]18. This chart was scribed by Katha Cabal.   CSN: [id number] Arrival date & time: 12/02/2010  6:28 PM   First MD Initiated Contact with Patient 12/02/10 1840      Chief Complaint  Patient presents with  . Urinary Retention    (Consider location/radiation/quality/duration/timing/severity/associated sxs/prior treatment) HPI  Level 5 Caveat applies for Dementia.    [name] is a 77 y.o. female who presents to the Emergency Department complaining of persistent moderate urinary retention with associated fever.  Symptoms are unchanged.  Patient last urinated last night.  Caregiver reports similar sx previous episodes.  Patient has hx of UTI and hematuria.  Caregiver denies vomiting and changes in appetite.  Patient had small BM today.  Patient eats prunes and prune juice daily.  Relieved by nothing.  Aggravated by nothing.  Patient has hx of ARF, constipation, chronic kidney disease and urinary retention.     Past Medical History  Diagnosis Date  . History of DVT (deep vein thrombosis)     Old LE DVT per doppler 4/12  . Depression   . Dysphagia     Schatzki ring dilatation 07/2006  . Diverticulosis   . Hypertension   . Dementia   . Tachycardia   . Stroke   . Rhabdomyolysis     05/2010 after being found down at home.  . ARF (acute renal failure)     05/2010. Resolved.  Marland Kitchen PNA (pneumonia)   . CKD (chronic kidney disease), stage II   . Hemorrhoids   . UTI (lower urinary tract infection)   . Urinary retention     Hx of in 05/2010.  Marland Kitchen Near syncope   . Constipation   . Depression with anxiety 09/30/2010  . Cellulitis   . Hematuria   . Anxiety     Past Surgical History  Procedure Date  . Hemorroidectomy   . Ovarian cyst removal   . Total hip arthroplasty   . Lumbar back surgery     2004.    History reviewed. No pertinent family history.  History  Substance Use Topics  . Smoking  status: Never Smoker   . Smokeless tobacco: Not on file  . Alcohol Use: No    OB History    Grav Para Term Preterm Abortions TAB SAB Ect Mult Living   1 1 1       1       Review of Systems  Unable to perform ROS: Dementia    Allergies  Hydrocodone and Strawberry  Home Medications   Current Outpatient Rx  Name Route Sig Dispense Refill  . ALPRAZOLAM 0.5 MG PO TABS Oral Take 0.5 mg by mouth 2 (two) times daily. At 8am and 2pm    . PRO-STAT 101 PO LIQD Oral Take 30 mLs by mouth 2 (two) times daily.      . ASPIRIN EC 81 MG PO TBEC Oral Take 81 mg by mouth daily.      Marland Kitchen DOCUSATE SODIUM 100 MG PO CAPS Oral Take 100 mg by mouth 2 (two) times daily.      . QUETIAPINE FUMARATE 25 MG PO TABS Oral Take 25 mg by mouth 2 (two) times daily.      . SERTRALINE HCL 50 MG PO TABS Oral Take 50 mg by mouth every morning.     Marland Kitchen BISACODYL 10 MG RE SUPP Rectal Place 10 mg rectally as needed. For  constipation     . SENNOSIDES-DOCUSATE SODIUM 8.6-50 MG PO TABS Oral Take 1 tablet by mouth 2 (two) times daily.        BP 106/44  Pulse 110  Temp(Src) 100.1 F (37.8 C) (Oral)  Resp 18  Ht 5\' 6"  (1.676 m)  Wt 115 lb (52.164 kg)  BMI 18.56 kg/m2  SpO2 98%  Physical Exam  Constitutional: She appears well-developed. No distress.  HENT:  Head: Normocephalic and atraumatic.  Mouth/Throat: Mucous membranes are normal. Mucous membranes are not dry.  Eyes: Conjunctivae and EOM are normal. No scleral icterus.  Cardiovascular: Regular rhythm and normal heart sounds.  Tachycardia present.  Exam reveals no gallop and no friction rub.   No murmur heard.      Mild Tachycardia  Pulmonary/Chest: Effort normal and breath sounds normal. No respiratory distress. She has no wheezes. She has no rales.  Abdominal: Bowel sounds are normal. She exhibits distension. There is no tenderness. There is no rebound and no guarding.       Lower abdominal distention   Musculoskeletal: Normal range of motion. She exhibits no  edema.  Neurological: Coordination normal.  Skin: Skin is warm and dry.  Psychiatric: She has a normal mood and affect. Her behavior is normal.    ED Course  Procedures (including critical care time)   DIAGNOSTIC STUDIES: Oxygen Saturation is 99% on room air, normal by my interpretation.    COORDINATION OF CARE:  7:07 PM  Physical exam complete.    Orders Placed This Encounter  Procedures  . Urine culture  . DG Abd Acute W/Chest  . Urinalysis with microscopic  . CBC  . Differential  . Comprehensive metabolic panel  . Lipase, blood  . Lactic acid, plasma  . Urine microscopic-add on  . Insert foley catheter       LABS / RADIOLOGY:   Labs Reviewed  URINALYSIS, ROUTINE W REFLEX MICROSCOPIC - Abnormal; Notable for the following:    Appearance HAZY (*)    Hgb urine dipstick SMALL (*)    Leukocytes, UA SMALL (*)    All other components within normal limits  CBC - Abnormal; Notable for the following:    WBC 12.6 (*)    Hemoglobin 11.0 (*)    HCT 33.4 (*)    RDW 15.7 (*)    All other components within normal limits  DIFFERENTIAL - Abnormal; Notable for the following:    Neutrophils Relative 79 (*)    Neutro Abs 10.0 (*)    All other components within normal limits  COMPREHENSIVE METABOLIC PANEL - Abnormal; Notable for the following:    Glucose, Bld 129 (*)    BUN 24 (*)    Albumin 3.2 (*)    GFR calc non Af Amer 54 (*)    GFR calc Af Amer 63 (*)    All other components within normal limits  LIPASE, BLOOD - Abnormal; Notable for the following:    Lipase 61 (*)    All other components within normal limits  URINE MICROSCOPIC-ADD ON - Abnormal; Notable for the following:    Squamous Epithelial / LPF MANY (*)    Bacteria, UA FEW (*)    All other components within normal limits  LACTIC ACID, PLASMA  URINE CULTURE   Results for orders placed during the hospital encounter of 12/02/10 (from the past 24 hour(s))  CBC     Status: Abnormal   Collection Time    12/02/10  7:20 PM  Component Value Range   WBC 12.6 (*) 4.0 - 10.5 (K/uL)   RBC 3.87  3.87 - 5.11 (MIL/uL)   Hemoglobin 11.0 (*) 12.0 - 15.0 (g/dL)   HCT 40.9 (*) 81.1 - 46.0 (%)   MCV 86.3  78.0 - 100.0 (fL)   MCH 28.4  26.0 - 34.0 (pg)   MCHC 32.9  30.0 - 36.0 (g/dL)   RDW 91.4 (*) 78.2 - 15.5 (%)   Platelets 181  150 - 400 (K/uL)  DIFFERENTIAL     Status: Abnormal   Collection Time   12/02/10  7:20 PM      Component Value Range   Neutrophils Relative 79 (*) 43 - 77 (%)   Lymphocytes Relative 13  12 - 46 (%)   Monocytes Relative 7  3 - 12 (%)   Eosinophils Relative 1  0 - 5 (%)   Basophils Relative 0  0 - 1 (%)   Neutro Abs 10.0 (*) 1.7 - 7.7 (K/uL)   Lymphs Abs 1.6  0.7 - 4.0 (K/uL)   Monocytes Absolute 0.9  0.1 - 1.0 (K/uL)   Eosinophils Absolute 0.1  0.0 - 0.7 (K/uL)   Basophils Absolute 0.0  0.0 - 0.1 (K/uL)  COMPREHENSIVE METABOLIC PANEL     Status: Abnormal   Collection Time   12/02/10  7:20 PM      Component Value Range   Sodium 135  135 - 145 (mEq/L)   Potassium 3.7  3.5 - 5.1 (mEq/L)   Chloride 102  96 - 112 (mEq/L)   CO2 24  19 - 32 (mEq/L)   Glucose, Bld 129 (*) 70 - 99 (mg/dL)   BUN 24 (*) 6 - 23 (mg/dL)   Creatinine, Ser 9.56  0.50 - 1.10 (mg/dL)   Calcium 8.7  8.4 - 21.3 (mg/dL)   Total Protein 6.7  6.0 - 8.3 (g/dL)   Albumin 3.2 (*) 3.5 - 5.2 (g/dL)   AST 19  0 - 37 (U/L)   ALT 11  0 - 35 (U/L)   Alkaline Phosphatase 65  39 - 117 (U/L)   Total Bilirubin 0.3  0.3 - 1.2 (mg/dL)   GFR calc non Af Amer 54 (*) >90 (mL/min)   GFR calc Af Amer 63 (*) >90 (mL/min)  LIPASE, BLOOD     Status: Abnormal   Collection Time   12/02/10  7:20 PM      Component Value Range   Lipase 61 (*) 11 - 59 (U/L)  LACTIC ACID, PLASMA     Status: Normal   Collection Time   12/02/10  7:20 PM      Component Value Range   Lactic Acid, Venous 0.8  0.5 - 2.2 (mmol/L)  URINALYSIS, ROUTINE W REFLEX MICROSCOPIC     Status: Abnormal   Collection Time   12/02/10  7:25 PM       Component Value Range   Color, Urine YELLOW  YELLOW    Appearance HAZY (*) CLEAR    Specific Gravity, Urine 1.025  1.005 - 1.030    pH 5.5  5.0 - 8.0    Glucose, UA NEGATIVE  NEGATIVE (mg/dL)   Hgb urine dipstick SMALL (*) NEGATIVE    Bilirubin Urine NEGATIVE  NEGATIVE    Ketones, ur NEGATIVE  NEGATIVE (mg/dL)   Protein, ur NEGATIVE  NEGATIVE (mg/dL)   Urobilinogen, UA 0.2  0.0 - 1.0 (mg/dL)   Nitrite NEGATIVE  NEGATIVE    Leukocytes, UA SMALL (*) NEGATIVE   URINE MICROSCOPIC-ADD  ON     Status: Abnormal   Collection Time   12/02/10  7:25 PM      Component Value Range   Squamous Epithelial / LPF MANY (*) RARE    WBC, UA TOO NUMEROUS TO COUNT  <3 (WBC/hpf)   RBC / HPF 11-20  <3 (RBC/hpf)   Bacteria, UA FEW (*) RARE     Dg Abd Acute W/chest  12/02/2010  *RADIOLOGY REPORT*  Clinical Data: Abdominal distension, cough and fever; weakness. Has not urinated since yesterday.  ACUTE ABDOMEN SERIES (ABDOMEN 2 VIEW & CHEST 1 VIEW)  Comparison: CT of the abdomen and pelvis performed 05/28/2010, renal ultrasound performed 06/16/2010, and abdominal radiograph performed 05/31/2010; chest radiograph performed 09/30/2010  Findings: The lungs are hyperexpanded, with flattening of the diaphragms, compatible with COPD.  Significant scarring is noted at the lung apices.  There is no evidence of pleural effusion or pneumothorax.  The cardiomediastinal silhouette is within normal limits.  The rectum is again significantly distended with stool, measuring perhaps 9.2 cm. Stool and air are noted throughout the colon; there is no evidence of small bowel dilatation to suggest obstruction. No free intra-abdominal air is identified on the provided upright view.  The bladder is not well characterized on radiograph.  In the past, the patient has had marked bladder distension.  No acute osseous abnormalities are seen; the sacroiliac joints are unremarkable in appearance.  Lumbar spinal fusion hardware is unremarkable in  appearance.  The patient's right hip hemiarthroplasty is partially imaged and appears grossly intact, without evidence of loosening.  IMPRESSION:  1.  Large amount of stool again noted within the rectum, measuring 9.2 cm in transverse dimension; this is worsened from the prior CT. 2.  Otherwise unremarkable bowel gas pattern; no free intra- abdominal air seen. 3.  Findings of COPD; significant scarring at the lung apices. 4.  Bladder not well characterized on radiograph; in the past, the patient has demonstrated marked bladder distension.  Question need for Foley catheter placement, as deemed clinically appropriate.  Original Report Authenticated By: Tonia Ghent, M.D.         MDM   MDM: The patient has an apparent urinary tract infection with low-grade fever and tachycardia although no hypotension. This combined with urinary retention requiring placement of a Foley catheter meets the patient not suitable for discharge from the ED and I will seek admission for her. I have ordered her IV antibiotics for urinary tract infection. There appears to be no pneumonia or bowel obstruction.     MEDICATIONS GIVEN IN THE E.D. Scheduled Meds:    . sodium chloride  500 mL Intravenous Once   Continuous Infusions:    . sodium chloride    . cefTRIAXone (ROCEPHIN) IVPB 1 gram/50 mL D5W 1 g (12/02/10 2112)  . DISCONTD: sodium chloride       DDX: Urinary retention, UTI, renal failure, dehydration, PNA, bowel obstruction, mesenteric ischemia are all considered in differential diagnosis.   7:53 PM 1200 cc amber colored urine after foley catheter placement. Abdominal distention has resolved suggestive of cause of abdominal distention.    IMPRESSION: No diagnosis found.   DISCHARGE MEDICATIONS: New Prescriptions   No medications on file      I personally performed the services described in this documentation, which was scribed in my presence. The recorded information has been reviewed and  considered.             Michele Bonier, MD 12/02/10 2217

## 2010-12-02 NOTE — H&P (Signed)
[name] is an 77 y.o. female.    PCP: No primary provider on file. Her PCP has been listed as Dr. Sherwood Baker in the past.  Chief Complaint: Inability to pass urine  HPI: This is a 77 year old, Caucasian female, who lives with the care provider. She has advanced dementia. She has a history of frequent UTIs. She was brought into the hospital by her caregiver at home because the patient hadn't passed any urine in over a day. There was also some mention of low-grade fever. In the ED a Foley catheter was placed and about the 1 L of urine came out. It was dark in color. Urinalysis suggested infection. Patient denies any pain or discomfort at this time. She is very confused and no history is available from her at this time. Her caregiver is also not here. I did call her son [name] and I did speak with him. He told me, that she's had frequent UTIs. She doesn't have indwelling Foley catheter anymore. She had some trouble with urinary retention many months ago, but none recently.   Prior to Admission medications   Medication Sig Start Date End Date Taking? Authorizing Provider  ALPRAZolam Prudy Feeler) 0.5 MG tablet Take 0.5 mg by mouth 2 (two) times daily. At 8am and 2pm   Yes Historical Provider, MD  Amino Acids-Protein Hydrolys (PRO-STAT 101) LIQD Take 30 mLs by mouth 2 (two) times daily.     Yes Historical Provider, MD  aspirin EC 81 MG tablet Take 81 mg by mouth daily.     Yes Historical Provider, MD  docusate sodium (COLACE) 100 MG capsule Take 100 mg by mouth 2 (two) times daily.     Yes Historical Provider, MD  QUEtiapine (SEROQUEL) 25 MG tablet Take 25 mg by mouth 2 (two) times daily.     Yes Historical Provider, MD  sertraline (ZOLOFT) 50 MG tablet Take 50 mg by mouth every morning.    Yes Historical Provider, MD  bisacodyl (DULCOLAX) 10 MG suppository Place 10 mg rectally as needed. For constipation     Historical Provider, MD  senna-docusate (SENOKOT-S) 8.6-50 MG per tablet Take 1 tablet by  mouth 2 (two) times daily.      Historical Provider, MD    Allergies:  Allergies  Allergen Reactions  . Hydrocodone Rash  . Strawberry Other (See Comments)    Unknown    Past Medical History  Diagnosis Date  . History of DVT (deep vein thrombosis)     Old LE DVT per doppler 4/12  . Depression   . Dysphagia     Schatzki ring dilatation 07/2006  . Diverticulosis   . Hypertension   . Dementia   . Tachycardia   . Stroke   . Rhabdomyolysis     05/2010 after being found down at home.  . ARF (acute renal failure)     05/2010. Resolved.  Marland Kitchen PNA (pneumonia)   . CKD (chronic kidney disease), stage II   . Hemorrhoids   . UTI (lower urinary tract infection)   . Urinary retention     Hx of in 05/2010.  Marland Kitchen Near syncope   . Constipation   . Depression with anxiety 09/30/2010  . Cellulitis   . Hematuria   . Anxiety     Past Surgical History  Procedure Date  . Hemorroidectomy   . Ovarian cyst removal   . Total hip arthroplasty   . Lumbar back surgery     2004.    Social History:  reports that she has never smoked. She does not have any smokeless tobacco history on file. She reports that she does not drink alcohol or use illicit drugs.  Family History: History reviewed. No pertinent family history.  Review of Systems  Unable to perform ROS: dementia   Blood pressure 106/44, pulse 110, temperature 100.1 F (37.8 C), temperature source Oral, resp. rate 18, height 5\' 6"  (1.676 m), weight 52.164 kg (115 lb), SpO2 98.00%. Physical Exam  Vitals reviewed. Constitutional: She appears well-developed and well-nourished. No distress.  HENT:  Head: Normocephalic and atraumatic.  Mouth/Throat: Oropharynx is clear and moist. No oropharyngeal exudate.  Eyes: EOM are normal. Pupils are equal, round, and reactive to light. Right eye exhibits no discharge. Left eye exhibits no discharge. No scleral icterus.  Neck: Normal range of motion. No JVD present. No tracheal deviation present. No  thyromegaly present.  Cardiovascular: Regular rhythm.  Tachycardia present.  Exam reveals no friction rub.   No murmur heard. Pulmonary/Chest: Effort normal and breath sounds normal. No stridor. No respiratory distress. She has no wheezes. She has no rales. She exhibits no tenderness.  Abdominal: Soft. Bowel sounds are normal. She exhibits no distension and no mass. There is no tenderness. There is no rebound.  Musculoskeletal: Normal range of motion. She exhibits no edema and no tenderness.  Lymphadenopathy:    She has no cervical adenopathy.  Neurological: She is alert. She is disoriented.  Skin: Skin is warm and dry. She is not diaphoretic. No erythema.  Psychiatric: She is inattentive.     Results for orders placed during the hospital encounter of 12/02/10 (from the past 48 hour(s))  CBC     Status: Abnormal   Collection Time   12/02/10  7:20 PM      Component Value Range Comment   WBC 12.6 (*) 4.0 - 10.5 (K/uL)    RBC 3.87  3.87 - 5.11 (MIL/uL)    Hemoglobin 11.0 (*) 12.0 - 15.0 (g/dL)    HCT 16.1 (*) 09.6 - 46.0 (%)    MCV 86.3  78.0 - 100.0 (fL)    MCH 28.4  26.0 - 34.0 (pg)    MCHC 32.9  30.0 - 36.0 (g/dL)    RDW 04.5 (*) 40.9 - 15.5 (%)    Platelets 181  150 - 400 (K/uL)   DIFFERENTIAL     Status: Abnormal   Collection Time   12/02/10  7:20 PM      Component Value Range Comment   Neutrophils Relative 79 (*) 43 - 77 (%)    Lymphocytes Relative 13  12 - 46 (%)    Monocytes Relative 7  3 - 12 (%)    Eosinophils Relative 1  0 - 5 (%)    Basophils Relative 0  0 - 1 (%)    Neutro Abs 10.0 (*) 1.7 - 7.7 (K/uL)    Lymphs Abs 1.6  0.7 - 4.0 (K/uL)    Monocytes Absolute 0.9  0.1 - 1.0 (K/uL)    Eosinophils Absolute 0.1  0.0 - 0.7 (K/uL)    Basophils Absolute 0.0  0.0 - 0.1 (K/uL)   COMPREHENSIVE METABOLIC PANEL     Status: Abnormal   Collection Time   12/02/10  7:20 PM      Component Value Range Comment   Sodium 135  135 - 145 (mEq/L)    Potassium 3.7  3.5 - 5.1 (mEq/L)     Chloride 102  96 - 112 (mEq/L)    CO2 24  19 - 32 (mEq/L)    Glucose, Bld 129 (*) 70 - 99 (mg/dL)    BUN 24 (*) 6 - 23 (mg/dL)    Creatinine, Ser 4.09  0.50 - 1.10 (mg/dL)    Calcium 8.7  8.4 - 10.5 (mg/dL)    Total Protein 6.7  6.0 - 8.3 (g/dL)    Albumin 3.2 (*) 3.5 - 5.2 (g/dL)    AST 19  0 - 37 (U/L)    ALT 11  0 - 35 (U/L)    Alkaline Phosphatase 65  39 - 117 (U/L)    Total Bilirubin 0.3  0.3 - 1.2 (mg/dL)    GFR calc non Af Amer 54 (*) >90 (mL/min)    GFR calc Af Amer 63 (*) >90 (mL/min)   LIPASE, BLOOD     Status: Abnormal   Collection Time   12/02/10  7:20 PM      Component Value Range Comment   Lipase 61 (*) 11 - 59 (U/L)   LACTIC ACID, PLASMA     Status: Normal   Collection Time   12/02/10  7:20 PM      Component Value Range Comment   Lactic Acid, Venous 0.8  0.5 - 2.2 (mmol/L)   URINALYSIS, ROUTINE W REFLEX MICROSCOPIC     Status: Abnormal   Collection Time   12/02/10  7:25 PM      Component Value Range Comment   Color, Urine YELLOW  YELLOW     Appearance HAZY (*) CLEAR     Specific Gravity, Urine 1.025  1.005 - 1.030     pH 5.5  5.0 - 8.0     Glucose, UA NEGATIVE  NEGATIVE (mg/dL)    Hgb urine dipstick SMALL (*) NEGATIVE     Bilirubin Urine NEGATIVE  NEGATIVE     Ketones, ur NEGATIVE  NEGATIVE (mg/dL)    Protein, ur NEGATIVE  NEGATIVE (mg/dL)    Urobilinogen, UA 0.2  0.0 - 1.0 (mg/dL)    Nitrite NEGATIVE  NEGATIVE     Leukocytes, UA SMALL (*) NEGATIVE    URINE MICROSCOPIC-ADD ON     Status: Abnormal   Collection Time   12/02/10  7:25 PM      Component Value Range Comment   Squamous Epithelial / LPF MANY (*) RARE     WBC, UA TOO NUMEROUS TO COUNT  <3 (WBC/hpf)    RBC / HPF 11-20  <3 (RBC/hpf)    Bacteria, UA FEW (*) RARE     Dg Abd Acute W/chest  12/02/2010  *RADIOLOGY REPORT*  Clinical Data: Abdominal distension, cough and fever; weakness. Has not urinated since yesterday.  ACUTE ABDOMEN SERIES (ABDOMEN 2 VIEW & CHEST 1 VIEW)  Comparison: CT of the  abdomen and pelvis performed 05/28/2010, renal ultrasound performed 06/16/2010, and abdominal radiograph performed 05/31/2010; chest radiograph performed 09/30/2010  Findings: The lungs are hyperexpanded, with flattening of the diaphragms, compatible with COPD.  Significant scarring is noted at the lung apices.  There is no evidence of pleural effusion or pneumothorax.  The cardiomediastinal silhouette is within normal limits.  The rectum is again significantly distended with stool, measuring perhaps 9.2 cm. Stool and air are noted throughout the colon; there is no evidence of small bowel dilatation to suggest obstruction. No free intra-abdominal air is identified on the provided upright view.  The bladder is not well characterized on radiograph.  In the past, the patient has had marked bladder distension.  No acute osseous abnormalities are seen; the sacroiliac  joints are unremarkable in appearance.  Lumbar spinal fusion hardware is unremarkable in appearance.  The patient's right hip hemiarthroplasty is partially imaged and appears grossly intact, without evidence of loosening.  IMPRESSION:  1.  Large amount of stool again noted within the rectum, measuring 9.2 cm in transverse dimension; this is worsened from the prior CT. 2.  Otherwise unremarkable bowel gas pattern; no free intra- abdominal air seen. 3.  Findings of COPD; significant scarring at the lung apices. 4.  Bladder not well characterized on radiograph; in the past, the patient has demonstrated marked bladder distension.  Question need for Foley catheter placement, as deemed clinically appropriate.  Original Report Authenticated By: Tonia Ghent, M.D.     Assessment/Plan  Principal Problem:  *UTI (lower urinary tract infection) Active Problems:  Dementia  Urinary retention  DNR (do not resuscitate)   #1 urinary Tract infection: Urine cultures will be sent to the lab. She'll be given ceftriaxone intravenously. She appears to be mildly  dehydrated, so, we'll give her gentle IV hydration as well.  #2 urinary retention: This is probably because of her infection. We'll leave the Foley in for now and will give a voiding trial after a couple days. She did have an ultrasound of her renal system and back in May, and that did not show any significant abnormalities.  #3 advanced dementia: Continue with Seroquel.  #4 history of constipation: Continue with her bowel regimen.  CODE STATUS was discussed with her son and she is a DO NOT RESUSCITATE.  DVT, prophylaxis will be initiated.  Further management decisions will depend on results of further testing and patient's response to treatment.  Welles Walthall 12/02/2010, 10:41 PM

## 2010-12-03 LAB — BASIC METABOLIC PANEL
BUN: 19 mg/dL (ref 6–23)
CO2: 25 mEq/L (ref 19–32)
Calcium: 8.3 mg/dL — ABNORMAL LOW (ref 8.4–10.5)
GFR calc non Af Amer: 80 mL/min — ABNORMAL LOW (ref >90)
Glucose, Bld: 99 mg/dL (ref 70–99)

## 2010-12-03 LAB — CBC
MCH: 28.2 pg (ref 26.0–34.0)
MCHC: 32.6 g/dL (ref 30.0–36.0)
MCV: 86.4 fL (ref 78.0–100.0)
Platelets: 168 10*3/uL (ref 150–400)

## 2010-12-03 LAB — MRSA PCR SCREENING: MRSA by PCR: NEGATIVE

## 2010-12-03 MED ORDER — SODIUM CHLORIDE 0.9 % IV SOLN
INTRAVENOUS | Status: DC
  Administered 2010-12-03: 950 mL via INTRAVENOUS

## 2010-12-03 MED ORDER — ACETAMINOPHEN 325 MG PO TABS: 650.0000 mg | ORAL_TABLET | Freq: Four times a day (QID) | ORAL | Status: DC | PRN

## 2010-12-03 MED ORDER — ACETAMINOPHEN 650 MG RE SUPP: 650.0000 mg | Freq: Four times a day (QID) | RECTAL | Status: DC | PRN

## 2010-12-03 MED ORDER — SERTRALINE HCL 50 MG PO TABS
50.0000 mg | ORAL_TABLET | ORAL | Status: DC
  Administered 2010-12-03 – 2010-12-06 (×4): 50 mg via ORAL
  Filled 2010-12-03 (×4): qty 1

## 2010-12-03 MED ORDER — ONDANSETRON HCL 4 MG PO TABS: 4.0000 mg | ORAL_TABLET | Freq: Four times a day (QID) | ORAL | Status: DC | PRN

## 2010-12-03 MED ORDER — ALPRAZOLAM 0.5 MG PO TABS
0.5000 mg | ORAL_TABLET | Freq: Two times a day (BID) | ORAL | Status: DC
  Administered 2010-12-03 – 2010-12-06 (×7): 0.5 mg via ORAL
  Filled 2010-12-03 (×7): qty 1

## 2010-12-03 MED ORDER — ALBUTEROL SULFATE (5 MG/ML) 0.5% IN NEBU: 2.5000 mg | INHALATION_SOLUTION | RESPIRATORY_TRACT | Status: DC | PRN

## 2010-12-03 MED ORDER — BISACODYL 10 MG RE SUPP: 10.0000 mg | RECTAL | Status: DC | PRN

## 2010-12-03 MED ORDER — SENNOSIDES-DOCUSATE SODIUM 8.6-50 MG PO TABS
1.0000 | ORAL_TABLET | Freq: Two times a day (BID) | ORAL | Status: DC
  Administered 2010-12-03 – 2010-12-04 (×3): 1 via ORAL
  Filled 2010-12-03 (×3): qty 1

## 2010-12-03 MED ORDER — QUETIAPINE FUMARATE 25 MG PO TABS
25.0000 mg | ORAL_TABLET | Freq: Two times a day (BID) | ORAL | Status: DC
  Administered 2010-12-03 – 2010-12-06 (×7): 25 mg via ORAL
  Filled 2010-12-03 (×7): qty 1

## 2010-12-03 MED ORDER — ONDANSETRON HCL 4 MG/2ML IJ SOLN: 4.0000 mg | Freq: Four times a day (QID) | INTRAMUSCULAR | Status: DC | PRN

## 2010-12-03 MED ORDER — DEXTROSE 5 % IV SOLN
1.0000 g | INTRAVENOUS | Status: DC
  Administered 2010-12-03 – 2010-12-05 (×3): 1 g via INTRAVENOUS
  Filled 2010-12-03 (×4): qty 1

## 2010-12-03 MED ORDER — DOCUSATE SODIUM 100 MG PO CAPS
100.0000 mg | ORAL_CAPSULE | Freq: Two times a day (BID) | ORAL | Status: DC
Start: 2010-12-03 — End: 2010-12-06
  Administered 2010-12-03 – 2010-12-06 (×7): 100 mg via ORAL
  Filled 2010-12-03 (×7): qty 1

## 2010-12-03 MED ORDER — ASPIRIN EC 81 MG PO TBEC
81.0000 mg | DELAYED_RELEASE_TABLET | Freq: Every day | ORAL | Status: DC
  Administered 2010-12-03 – 2010-12-06 (×4): 81 mg via ORAL
  Filled 2010-12-03 (×4): qty 1

## 2010-12-03 NOTE — Progress Notes (Signed)
Physical Therapy Evaluation Patient Details Name: [name] MRN: [id number] DOB: [birth year] Today's Date: 12/03/2010  Problem List:  Patient Active Problem List  Diagnoses  . HIP, ARTHRITIS, DEGEN./OSTEO  . HIP PAIN  . Cellulitis  . Pseudogout  . Hematuria, microscopic  . Dementia  . Emphysema  . Depression with anxiety  . Anemia  . UTI (lower urinary tract infection)  . Urinary retention  . DNR (do not resuscitate)    Past Medical History:  Past Medical History  Diagnosis Date  . History of DVT (deep vein thrombosis)     Old LE DVT per doppler 4/12  . Depression   . Dysphagia     Schatzki ring dilatation 07/2006  . Diverticulosis   . Hypertension   . Dementia   . Tachycardia   . Stroke   . Rhabdomyolysis     05/2010 after being found down at home.  . ARF (acute renal failure)     05/2010. Resolved.  Marland Kitchen PNA (pneumonia)   . CKD (chronic kidney disease), stage II   . Hemorrhoids   . UTI (lower urinary tract infection)   . Urinary retention     Hx of in 05/2010.  Marland Kitchen Near syncope   . Constipation   . Depression with anxiety 09/30/2010  . Cellulitis   . Hematuria   . Anxiety    Past Surgical History:  Past Surgical History  Procedure Date  . Hemorroidectomy   . Ovarian cyst removal   . Total hip arthroplasty   . Lumbar back surgery     2004.    PT Assessment/Plan/Recommendation PT Assessment Clinical Impression Statement: pt with advanced dementia to the point where a full eval could not be done...she is not appropriate for PT PT Recommendation/Assessment: Patent does not need any further PT services No Skilled PT: Patient unable to participate in therapy PT Goals     PT Evaluation Precautions/Restrictions  Precautions Precautions: Fall Required Braces or Orthoses: No Restrictions Weight Bearing Restrictions: No Prior Functioning  Home Living Lives With:  (no prior history available)   Cognition Cognition Arousal/Alertness:  Awake/alert Overall Cognitive Status: Impaired Memory: Appears impaired Orientation Level: Oriented to person;Disoriented to situation;Disoriented to time;Disoriented to place Following Commands: Follows one step commands inconsistently Awareness of Errors: Decreased awareness of errors made Awareness of Deficits: Decreased awareness of deficits Problem Solving: Requires assistance for problem solving Sensation/Coordination Sensation Light Touch: Appears Intact Stereognosis: Not tested Hot/Cold: Not tested Proprioception: Not tested Extremity Assessment   Mobility (including Balance) Bed Mobility Bed Mobility: Yes Rolling Right: 3: Mod assist;With rail Rolling Left: 3: Mod assist;With rail Transfers Transfers: No Ambulation/Gait Ambulation/Gait: No Stairs: No Wheelchair Mobility Wheelchair Mobility: No  Posture/Postural Control Posture/Postural Control: No significant limitations Balance Balance Assessed: No Exercise    End of Session PT - End of Session Activity Tolerance: Patient tolerated treatment well Patient left: in bed;with call bell in reach;with bed alarm set General Behavior During Session: [facility] for tasks performed Cognition: Impaired, at baseline  Konrad Penta 12/03/2010, 9:41 AM

## 2010-12-03 NOTE — Progress Notes (Signed)
Subjective: Patient appears very confused although she does have a history of dementia and I believe this is her baseline  Objective: Vital signs in last 24 hours: Temp:  [98 F (36.7 C)-100.3 F (37.9 C)] 98.1 F (36.7 C) (10/26 0616) Pulse Rate:  [82-115] 88  (10/26 0616) Resp:  [16-20] 16  (10/26 0616) BP: (106-136)/(39-71) 136/71 mmHg (10/26 0616) SpO2:  [97 %-100 %] 99 % (10/26 0616) Weight:  [52.164 kg (115 lb)-54 kg (119 lb 0.8 oz)] 119 lb 0.8 oz (54 kg) (10/26 0145) Weight change:  Body mass index is 21.09 kg/(m^2).  Intake/Output from previous day: 10/25 0701 - 10/26 0700 In: -  Out: 1650 [Urine:1650]   PHYSICAL EXAM: Gen Exam: Awake and pleasantly confused with clear speech.   Neck: Supple, No JVD.   Chest: B/L Clear.   CVS: S1 S2 Regular, no murmurs.  Abdomen: soft, BS +, non tender, non distended.  Extremities: no edema, warm.   Neurologic: Non Focal.   Skin: No Rash.   Wounds: N/A.    Lab Results:  Basename 12/03/10 0543 12/02/10 1920  WBC 9.9 12.6*  HGB 10.6* 11.0*  HCT 32.5* 33.4*  PLT 168 181   CMET CMP     Component Value Date/Time   NA 139 12/03/2010 0543   K 3.5 12/03/2010 0543   CL 106 12/03/2010 0543   CO2 25 12/03/2010 0543   GLUCOSE 99 12/03/2010 0543   BUN 19 12/03/2010 0543   CREATININE 0.75 12/03/2010 0543   CALCIUM 8.3* 12/03/2010 0543   PROT 6.7 12/02/2010 1920   ALBUMIN 3.2* 12/02/2010 1920   AST 19 12/02/2010 1920   ALT 11 12/02/2010 1920   ALKPHOS 65 12/02/2010 1920   BILITOT 0.3 12/02/2010 1920   GFRNONAA 80* 12/03/2010 0543   GFRAA >90 12/03/2010 0543    LIPIDS @LASTLIPID @ @LASTBNP @ COAGS No results found for this basename: PT:2,INR:2 in the last 72 hours  Studies/Results: Dg Abd Acute W/chest  12/02/2010  *RADIOLOGY REPORT*  Clinical Data: Abdominal distension, cough and fever; weakness. Has not urinated since yesterday.  ACUTE ABDOMEN SERIES (ABDOMEN 2 VIEW & CHEST 1 VIEW)  Comparison: CT of the abdomen and  pelvis performed 05/28/2010, renal ultrasound performed 06/16/2010, and abdominal radiograph performed 05/31/2010; chest radiograph performed 09/30/2010  Findings: The lungs are hyperexpanded, with flattening of the diaphragms, compatible with COPD.  Significant scarring is noted at the lung apices.  There is no evidence of pleural effusion or pneumothorax.  The cardiomediastinal silhouette is within normal limits.  The rectum is again significantly distended with stool, measuring perhaps 9.2 cm. Stool and air are noted throughout the colon; there is no evidence of small bowel dilatation to suggest obstruction. No free intra-abdominal air is identified on the provided upright view.  The bladder is not well characterized on radiograph.  In the past, the patient has had marked bladder distension.  No acute osseous abnormalities are seen; the sacroiliac joints are unremarkable in appearance.  Lumbar spinal fusion hardware is unremarkable in appearance.  The patient's right hip hemiarthroplasty is partially imaged and appears grossly intact, without evidence of loosening.  IMPRESSION:  1.  Large amount of stool again noted within the rectum, measuring 9.2 cm in transverse dimension; this is worsened from the prior CT. 2.  Otherwise unremarkable bowel gas pattern; no free intra- abdominal air seen. 3.  Findings of COPD; significant scarring at the lung apices. 4.  Bladder not well characterized on radiograph; in the past, the patient has demonstrated  marked bladder distension.  Question need for Foley catheter placement, as deemed clinically appropriate.  Original Report Authenticated By: Tonia Ghent, M.D.    Medications:  Continuous:    . sodium chloride 950 mL (12/03/10 1114)  . DISCONTD: sodium chloride    . DISCONTD: sodium chloride      Assessment/Plan: Principal Problem:  *UTI (lower urinary tract infection -We will continue her Rocephin await urine culture sensitivity. We will decrease IV fluids.  She is afebrile and nontoxic looking. Her WBC count has now normalized.  Active Problems:  Dementia -This is at baseline. We will continue Seroquel and Xanax. The Zoloft will also be continued.   Urinary retention -A Foley catheter will be left in place for another day or so before pursuing a voiding trial.   DNR (do not resuscitate) -This will be honored.   Maretta Bees, MD. 12/03/2010, 1:24 PM

## 2010-12-04 LAB — URINE CULTURE
Colony Count: NO GROWTH
Culture  Setup Time: 201210261706

## 2010-12-04 LAB — BASIC METABOLIC PANEL
BUN: 13 mg/dL (ref 6–23)
CO2: 24 mEq/L (ref 19–32)
Calcium: 8.2 mg/dL — ABNORMAL LOW (ref 8.4–10.5)
Creatinine, Ser: 0.86 mg/dL (ref 0.50–1.10)
GFR calc Af Amer: 74 mL/min — ABNORMAL LOW (ref >90)

## 2010-12-04 LAB — GLUCOSE, CAPILLARY: Glucose-Capillary: 111 mg/dL — ABNORMAL HIGH (ref 70–99)

## 2010-12-04 MED ORDER — FLEET ENEMA 7-19 GM/118ML RE ENEM: 1.0000 | ENEMA | Freq: Every day | RECTAL | Status: DC | PRN

## 2010-12-04 MED ORDER — SENNA 8.6 MG PO TABS
2.0000 | ORAL_TABLET | Freq: Two times a day (BID) | ORAL | Status: DC
  Administered 2010-12-04 – 2010-12-06 (×5): 17.2 mg via ORAL
  Filled 2010-12-04 (×3): qty 2
  Filled 2010-12-04: qty 1
  Filled 2010-12-04: qty 2

## 2010-12-04 MED ORDER — FLEET ENEMA 7-19 GM/118ML RE ENEM
1.0000 | ENEMA | Freq: Once | RECTAL | Status: AC
  Administered 2010-12-04: 1 via RECTAL

## 2010-12-04 MED ORDER — ENSURE IMMUNE HEALTH PO LIQD
237.0000 mL | Freq: Three times a day (TID) | ORAL | Status: DC
  Administered 2010-12-04 – 2010-12-06 (×5): 237 mL via ORAL

## 2010-12-04 MED ORDER — POLYETHYLENE GLYCOL 3350 17 G PO PACK
17.0000 g | PACK | Freq: Two times a day (BID) | ORAL | Status: DC
  Administered 2010-12-04 – 2010-12-06 (×4): 17 g via ORAL
  Filled 2010-12-04 (×4): qty 1

## 2010-12-04 NOTE — Progress Notes (Signed)
Subjective: Still very confused, I believe this is her baseline.  Objective: Vital signs in last 24 hours: Temp:  [97.7 F (36.5 C)-99.4 F (37.4 C)] 99.2 F (37.3 C) (10/27 0600) Pulse Rate:  [82-86] 86  (10/27 0600) Resp:  [16-18] 18  (10/27 0600) BP: (98-135)/(62-67) 135/62 mmHg (10/27 0600) SpO2:  [97 %-99 %] 97 % (10/27 0600) Weight change:  Body mass index is 21.09 kg/(m^2).  Intake/Output from previous day: 10/26 0701 - 10/27 0700 In: 660 [P.O.:660] Out: 1625 [Urine:1625]   PHYSICAL EXAM: Gen Exam: Awake and pleasantly confused with clear speech.   Neck: Supple, No JVD.   Chest: B/L Clear.   CVS: S1 S2 Regular, no murmurs.  Abdomen: soft, BS +, non tender, non distended.  Extremities: no edema, warm.   Neurologic: Non Focal.   Skin: No Rash.   Wounds: N/A.    Lab Results:  Basename 12/03/10 0543 12/02/10 1920  WBC 9.9 12.6*  HGB 10.6* 11.0*  HCT 32.5* 33.4*  PLT 168 181   CMET CMP     Component Value Date/Time   NA 136 12/04/2010 0448   K 3.4* 12/04/2010 0448   CL 104 12/04/2010 0448   CO2 24 12/04/2010 0448   GLUCOSE 85 12/04/2010 0448   BUN 13 12/04/2010 0448   CREATININE 0.86 12/04/2010 0448   CALCIUM 8.2* 12/04/2010 0448   PROT 6.7 12/02/2010 1920   ALBUMIN 3.2* 12/02/2010 1920   AST 19 12/02/2010 1920   ALT 11 12/02/2010 1920   ALKPHOS 65 12/02/2010 1920   BILITOT 0.3 12/02/2010 1920   GFRNONAA 64* 12/04/2010 0448   GFRAA 74* 12/04/2010 0448      No results found for this basename: PT:2,INR:2 in the last 72 hours  Studies/Results: Dg Abd Acute W/chest  12/02/2010  *RADIOLOGY REPORT*  Clinical Data: Abdominal distension, cough and fever; weakness. Has not urinated since yesterday.  ACUTE ABDOMEN SERIES (ABDOMEN 2 VIEW & CHEST 1 VIEW)  Comparison: CT of the abdomen and pelvis performed 05/28/2010, renal ultrasound performed 06/16/2010, and abdominal radiograph performed 05/31/2010; chest radiograph performed 09/30/2010  Findings: The  lungs are hyperexpanded, with flattening of the diaphragms, compatible with COPD.  Significant scarring is noted at the lung apices.  There is no evidence of pleural effusion or pneumothorax.  The cardiomediastinal silhouette is within normal limits.  The rectum is again significantly distended with stool, measuring perhaps 9.2 cm. Stool and air are noted throughout the colon; there is no evidence of small bowel dilatation to suggest obstruction. No free intra-abdominal air is identified on the provided upright view.  The bladder is not well characterized on radiograph.  In the past, the patient has had marked bladder distension.  No acute osseous abnormalities are seen; the sacroiliac joints are unremarkable in appearance.  Lumbar spinal fusion hardware is unremarkable in appearance.  The patient's right hip hemiarthroplasty is partially imaged and appears grossly intact, without evidence of loosening.  IMPRESSION:  1.  Large amount of stool again noted within the rectum, measuring 9.2 cm in transverse dimension; this is worsened from the prior CT. 2.  Otherwise unremarkable bowel gas pattern; no free intra- abdominal air seen. 3.  Findings of COPD; significant scarring at the lung apices. 4.  Bladder not well characterized on radiograph; in the past, the patient has demonstrated marked bladder distension.  Question need for Foley catheter placement, as deemed clinically appropriate.  Original Report Authenticated By: Tonia Ghent, M.D.    Medications:  Continuous:    .  DISCONTD: sodium chloride 950 mL (12/03/10 1114)    Assessment/Plan: Principal Problem:  *UTI (lower urinary tract infection -We will continue her Rocephin await urine culture sensitivity.  - She is afebrile and nontoxic looking. Her WBC count has now normalized.  Active Problems:  Dementia -This is at baseline. We will continue Seroquel and Xanax. The Zoloft will also be continued.   Urinary retention -A Foley catheter will be  left in place for another day or so before pursuing a voiding trial.   DNR (do not resuscitate) -This will be honored.  Disposition -Back to skilled nursing facility Monday or Tuesday.   Maretta Bees, MD. 12/04/2010, 11:38 AM

## 2010-12-05 NOTE — Discharge Summary (Addendum)
Patient ID: [name] MRN: [id number] DOB/AGE: 75/08/1930 75 y.o.  Admit date: 12/02/2010 Discharge date: 12/06/2010  Primary Care Physician:  No primary provider on file.  Discharge Diagnoses:    Present on Admission:  .UTI (lower urinary tract infection) .Urinary retention .Dementia .DNR (do not resuscitate)  Principal Problem:  *UTI (lower urinary tract infection) Active Problems:  Dementia  Urinary retention  DNR (do not resuscitate)   Current Discharge Medication List    CONTINUE these medications which have NOT CHANGED   Details  ALPRAZolam (XANAX) 0.5 MG tablet Take 0.5 mg by mouth 2 (two) times daily. At 8am and 2pm    Amino Acids-Protein Hydrolys (PRO-STAT 101) LIQD Take 30 mLs by mouth 2 (two) times daily.      aspirin EC 81 MG tablet Take 81 mg by mouth daily.      docusate sodium (COLACE) 100 MG capsule Take 100 mg by mouth 2 (two) times daily.      QUEtiapine (SEROQUEL) 25 MG tablet Take 25 mg by mouth 2 (two) times daily.      sertraline (ZOLOFT) 50 MG tablet Take 50 mg by mouth every morning.     bisacodyl (DULCOLAX) 10 MG suppository Place 10 mg rectally as needed. For constipation     senna-docusate (SENOKOT-S) 8.6-50 MG per tablet Take 1 tablet by mouth 2 (two) times daily.      LEVOQUIN 500 MG PO  DAILY ( STOP DAY 11/1)    Disposition and Follow-up:  Patient will be followed up in SNF Needs voiding trial for her urinary retention in 1-2 days  Consults:  none  Significant Diagnostic Studies:  Dg Abd Acute W/chest  12/02/2010  *RADIOLOGY REPORT*  Clinical Data: Abdominal distension, cough and fever; weakness. Has not urinated since yesterday.  ACUTE ABDOMEN SERIES (ABDOMEN 2 VIEW & CHEST 1 VIEW)  Comparison: CT of the abdomen and pelvis performed 05/28/2010, renal ultrasound performed 06/16/2010, and abdominal radiograph performed 05/31/2010; chest radiograph performed 09/30/2010  Findings: The lungs are hyperexpanded, with flattening  of the diaphragms, compatible with COPD.  Significant scarring is noted at the lung apices.  There is no evidence of pleural effusion or pneumothorax.  The cardiomediastinal silhouette is within normal limits.  The rectum is again significantly distended with stool, measuring perhaps 9.2 cm. Stool and air are noted throughout the colon; there is no evidence of small bowel dilatation to suggest obstruction. No free intra-abdominal air is identified on the provided upright view.  The bladder is not well characterized on radiograph.  In the past, the patient has had marked bladder distension.  No acute osseous abnormalities are seen; the sacroiliac joints are unremarkable in appearance.  Lumbar spinal fusion hardware is unremarkable in appearance.  The patient's right hip hemiarthroplasty is partially imaged and appears grossly intact, without evidence of loosening.  IMPRESSION:  1.  Large amount of stool again noted within the rectum, measuring 9.2 cm in transverse dimension; this is worsened from the prior CT. 2.  Otherwise unremarkable bowel gas pattern; no free intra- abdominal air seen. 3.  Findings of COPD; significant scarring at the lung apices. 4.  Bladder not well characterized on radiograph; in the past, the patient has demonstrated marked bladder distension.  Question need for Foley catheter placement, as deemed clinically appropriate.  Original Report Authenticated By: Tonia Ghent, M.D.    Brief H and P: 77 year old, Caucasian female, who lives with the care provider. She has advanced dementia. She has a history of frequent UTIs.  She was brought into the hospital by her caregiver at home because the patient hadn't passed any urine in over a day. There was also some mention of low-grade fever. In the ED a Foley catheter was placed and about the 1 L of urine came out. It was dark in color. Urinalysis suggested infection. Patient denies any pain or discomfort at this time. She is very confused and no  history is available from her at this time. Her caregiver is also not here. I did call her son Lenni Reckner and I did speak with him. He told me, that she's had frequent UTIs. She doesn't have indwelling Foley catheter anymore. She had some trouble with urinary retention many months ago, but none recently.   Physical Exam on Discharge:  Filed Vitals:   12/04/10 0600 12/04/10 1400 12/04/10 2200 12/05/10 0631  BP: 135/62 128/75 114/67 147/73  Pulse: 86 82 105 91  Temp: 99.2 F (37.3 C) 98.3 F (36.8 C) 98.8 F (37.1 C) 98.5 F (36.9 C)  TempSrc: Oral Axillary Oral Oral  Resp: 18 18 20 20   Height:      Weight:      SpO2: 97% 99% 97% 98%     Intake/Output Summary (Last 24 hours) at 12/05/10 1439 Last data filed at 12/05/10 0800  Gross per 24 hour  Intake    420 ml  Output    752 ml  Net   -332 ml    General: Alert, awake, oriented x1, in no acute distress. HEENT: No bruits, no goiter. Heart: Regular rate and rhythm, without murmurs, rubs, gallops. Lungs: Clear to auscultation bilaterally. Abdomen: Soft, nontender, nondistended, positive bowel sounds. Extremities: No clubbing cyanosis or edema with positive pedal pulses. Neuro: Grossly intact, nonfocal.  CBC:    Component Value Date/Time   WBC 9.9 12/03/2010 0543   HGB 10.6* 12/03/2010 0543   HCT 32.5* 12/03/2010 0543   PLT 168 12/03/2010 0543   MCV 86.4 12/03/2010 0543   NEUTROABS 10.0* 12/02/2010 1920   LYMPHSABS 1.6 12/02/2010 1920   MONOABS 0.9 12/02/2010 1920   EOSABS 0.1 12/02/2010 1920   BASOSABS 0.0 12/02/2010 1920    Basic Metabolic Panel:    Component Value Date/Time   NA 136 12/04/2010 0448   K 3.4* 12/04/2010 0448   CL 104 12/04/2010 0448   CO2 24 12/04/2010 0448   BUN 13 12/04/2010 0448   CREATININE 0.86 12/04/2010 0448   GLUCOSE 85 12/04/2010 0448   CALCIUM 8.2* 12/04/2010 0448    Hospital Course:  UTI (lower urinary tract infection ) - patient noted to have UTI on UA with low grade temp  and leucocytosis. She was stared on IV rocephin and urine cx sent in the ED. Her cx so far have been negative for growth She remains afebrile and  with no leucocytosis  she will be discharged on total 7 days of abx . she will be continued on 3 more days po po levaquin to complete a 7 day course. Completes on 11/1   Dementia  -This is at baseline. She was continued on  continue Seroquel and Xanax. The Zoloft was also  continued.   Urinary retention  Likely secondary to UTI A Foley catheter was placed on admission and voiding trial attempted. Voiding trail done but unsuccessful with urinary retention, likely from her current UTI. she would need voiding trials in the rest home.  Patient clinically stable to be trasnferred to Rest home with home health and PT  Time spent on Discharge: 40 Mins  Signed: Eddie North 12/05/2010, 2:39 PM

## 2010-12-06 MED ORDER — LEVOFLOXACIN 250 MG PO TABS: 250.0000 mg | ORAL_TABLET | Freq: Every day | ORAL | Status: AC

## 2010-12-06 NOTE — Progress Notes (Signed)
Patient has not voided in over eight hours, 14 french foley catheter placed per MD order.   400 cc of clear yellow urine on insertion.  Patient tolerated well.

## 2010-12-06 NOTE — Progress Notes (Signed)
Subjective: Voiding trial done overnight but unsuccessful. No other overnight events  Objective:  Vital signs in last 24 hours:  Filed Vitals:   12/05/10 0631 12/05/10 1400 12/05/10 2038 12/06/10 0548  BP: 147/73 144/71 135/71 127/71  Pulse: 91 110 99 91  Temp: 98.5 F (36.9 C) 97.4 F (36.3 C) 99.2 F (37.3 C) 98 F (36.7 C)  TempSrc: Oral  Oral Axillary  Resp: 20 20 22 22   Height:      Weight:      SpO2: 98% 94% 95% 95%    Intake/Output from previous day:   Intake/Output Summary (Last 24 hours) at 12/06/10 1005 Last data filed at 12/06/10 0548  Gross per 24 hour  Intake    240 ml  Output    502 ml  Net   -262 ml    Physical Exam:  General: Alert, awake, oriented x1, in no acute distress.  HEENT: No bruits, no goiter.  Heart: Regular rate and rhythm, without murmurs, rubs, gallops.  Lungs: Clear to auscultation bilaterally.  Abdomen: Soft, nontender, nondistended, positive bowel sounds.  Extremities: No clubbing cyanosis or edema with positive pedal pulses.  Neuro: AAOX 1 , nonfocal.    Lab Results:  Basic Metabolic Panel:    Component Value Date/Time   NA 136 12/04/2010 0448   K 3.4* 12/04/2010 0448   CL 104 12/04/2010 0448   CO2 24 12/04/2010 0448   BUN 13 12/04/2010 0448   CREATININE 0.86 12/04/2010 0448   GLUCOSE 85 12/04/2010 0448   CALCIUM 8.2* 12/04/2010 0448   CBC:    Component Value Date/Time   WBC 9.9 12/03/2010 0543   HGB 10.6* 12/03/2010 0543   HCT 32.5* 12/03/2010 0543   PLT 168 12/03/2010 0543   MCV 86.4 12/03/2010 0543   NEUTROABS 10.0* 12/02/2010 1920   LYMPHSABS 1.6 12/02/2010 1920   MONOABS 0.9 12/02/2010 1920   EOSABS 0.1 12/02/2010 1920   BASOSABS 0.0 12/02/2010 1920    Recent Results (from the past 240 hour(s))  URINE CULTURE     Status: Normal   Collection Time   12/02/10 10:24 PM      Component Value Range Status Comment   Specimen Description URINE, CATHETERIZED   Final    Special Requests NONE   Final    Setup Time [id number]   Final    Colony Count NO GROWTH   Final    Culture NO GROWTH   Final    Report Status 12/04/2010 FINAL   Final   MRSA PCR SCREENING     Status: Normal   Collection Time   12/03/10  4:28 PM      Component Value Range Status Comment   MRSA by PCR NEGATIVE  NEGATIVE  Final     Studies/Results: No results found.  Medications: Scheduled Meds:   . ALPRAZolam  0.5 mg Oral BID  . aspirin EC  81 mg Oral Daily  . cefTRIAXone (ROCEPHIN) IVPB 1 gram/50 mL D5W  1 g Intravenous Once  . cefTRIAXone (ROCEPHIN) IV  1 g Intravenous Q24H  . docusate sodium  100 mg Oral BID  . feeding supplement  237 mL Oral TID WC  . polyethylene glycol  17 g Oral BID  . QUEtiapine  25 mg Oral BID  . senna  2 tablet Oral BID  . sertraline  50 mg Oral QAM   Continuous Infusions:  PRN Meds:.acetaminophen, acetaminophen, albuterol, bisacodyl, ondansetron (ZOFRAN) IV, ondansetron, sodium phosphate  Assessment/Plan:  Principal Problem:  *UTI (lower  urinary tract infection) -started on IV ceftraixone and responding well. Afebrile and leucocytosis resolved Will be discharged on po levoquin with  total 7 days of abx   Active Problems:  Dementia Baseline and stable   Urinary retention Likely secondary to UTI. On foley. Attempted voiding trial but unsuccessful with urinary retention Needs voiding trail in rest home  Patient clinically stable to be discharged to rest home. Discharged summary prepared from 10/28   DNR (do not resuscitate)    LOS: 4 days   Winston Misner 12/06/2010, 10:05 AM

## 2011-02-19 ENCOUNTER — Encounter (HOSPITAL_COMMUNITY): Payer: Self-pay | Admitting: *Deleted

## 2011-02-19 ENCOUNTER — Emergency Department (HOSPITAL_COMMUNITY): Payer: Medicare Other

## 2011-02-19 ENCOUNTER — Emergency Department (HOSPITAL_COMMUNITY)
Admission: EM | Admit: 2011-02-19 | Discharge: 2011-02-20 | Disposition: A | Discharge status: Skilled Nursing Facility | Payer: Medicare Other | Attending: Emergency Medicine | Admitting: Emergency Medicine

## 2011-02-19 DIAGNOSIS — I129 Hypertensive chronic kidney disease with stage 1 through stage 4 chronic kidney disease, or unspecified chronic kidney disease: Secondary | ICD-10-CM | POA: Insufficient documentation

## 2011-02-19 DIAGNOSIS — F29 Unspecified psychosis not due to a substance or known physiological condition: Secondary | ICD-10-CM | POA: Insufficient documentation

## 2011-02-19 DIAGNOSIS — Z8701 Personal history of pneumonia (recurrent): Secondary | ICD-10-CM | POA: Insufficient documentation

## 2011-02-19 DIAGNOSIS — Z8673 Personal history of transient ischemic attack (TIA), and cerebral infarction without residual deficits: Secondary | ICD-10-CM | POA: Insufficient documentation

## 2011-02-19 DIAGNOSIS — R4182 Altered mental status, unspecified: Secondary | ICD-10-CM | POA: Insufficient documentation

## 2011-02-19 DIAGNOSIS — N390 Urinary tract infection, site not specified: Secondary | ICD-10-CM | POA: Insufficient documentation

## 2011-02-19 DIAGNOSIS — M19039 Primary osteoarthritis, unspecified wrist: Secondary | ICD-10-CM | POA: Insufficient documentation

## 2011-02-19 DIAGNOSIS — M6282 Rhabdomyolysis: Secondary | ICD-10-CM | POA: Insufficient documentation

## 2011-02-19 DIAGNOSIS — Z7982 Long term (current) use of aspirin: Secondary | ICD-10-CM | POA: Insufficient documentation

## 2011-02-19 DIAGNOSIS — F341 Dysthymic disorder: Secondary | ICD-10-CM | POA: Insufficient documentation

## 2011-02-19 DIAGNOSIS — N189 Chronic kidney disease, unspecified: Secondary | ICD-10-CM | POA: Insufficient documentation

## 2011-02-19 DIAGNOSIS — M1990 Unspecified osteoarthritis, unspecified site: Secondary | ICD-10-CM

## 2011-02-19 DIAGNOSIS — R000 Tachycardia, unspecified: Secondary | ICD-10-CM | POA: Insufficient documentation

## 2011-02-19 DIAGNOSIS — Z86718 Personal history of other venous thrombosis and embolism: Secondary | ICD-10-CM | POA: Insufficient documentation

## 2011-02-19 LAB — BASIC METABOLIC PANEL
GFR calc Af Amer: 75 mL/min — ABNORMAL LOW (ref >90)
GFR calc non Af Amer: 65 mL/min — ABNORMAL LOW (ref >90)
Potassium: 3.9 mEq/L (ref 3.5–5.1)
Sodium: 139 mEq/L (ref 135–145)

## 2011-02-19 LAB — URINALYSIS, ROUTINE W REFLEX MICROSCOPIC
Nitrite: NEGATIVE
Specific Gravity, Urine: 1.025 (ref 1.005–1.030)
Urobilinogen, UA: 0.2 mg/dL (ref 0.0–1.0)

## 2011-02-19 LAB — URINE MICROSCOPIC-ADD ON

## 2011-02-19 LAB — URINE CULTURE

## 2011-02-19 LAB — CBC
MCH: 26.3 pg (ref 26.0–34.0)
MCHC: 32.2 g/dL (ref 30.0–36.0)
Platelets: 203 10*3/uL (ref 150–400)
RBC: 4.14 MIL/uL (ref 3.87–5.11)

## 2011-02-19 LAB — DIFFERENTIAL
Basophils Relative: 0 % (ref 0–1)
Eosinophils Absolute: 0.1 10*3/uL (ref 0.0–0.7)
Lymphs Abs: 1.6 10*3/uL (ref 0.7–4.0)
Neutrophils Relative %: 72 % (ref 43–77)

## 2011-02-19 MED ORDER — DEXTROSE 5 % IV SOLN
1.0000 g | Freq: Once | INTRAVENOUS | Status: AC
  Administered 2011-02-19: 1 g via INTRAVENOUS
  Filled 2011-02-19: qty 10

## 2011-02-19 MED ORDER — ACETAMINOPHEN 650 MG RE SUPP
650.0000 mg | Freq: Once | RECTAL | Status: AC
  Administered 2011-02-19: 650 mg via RECTAL
  Filled 2011-02-19: qty 1

## 2011-02-19 MED ORDER — AMOXICILLIN-POT CLAVULANATE 875-125 MG PO TABS: 1.0000 | ORAL_TABLET | Freq: Two times a day (BID) | ORAL | Status: AC

## 2011-02-19 MED ORDER — SODIUM CHLORIDE 0.9 % IV SOLN
INTRAVENOUS | Status: DC
  Administered 2011-02-19 (×2): via INTRAVENOUS

## 2011-02-19 MED ORDER — SODIUM CHLORIDE 0.9 % IV BOLUS (SEPSIS)
500.0000 mL | Freq: Once | INTRAVENOUS | Status: AC
  Administered 2011-02-19: 500 mL via INTRAVENOUS

## 2011-02-19 NOTE — ED Provider Notes (Signed)
History    Scribed for [name], MD, the patient was seen in room APA04/APA04. This chart was scribed by Katha Cabal.   CSN: [id number]  Arrival date & time 02/19/11  1549   First MD Initiated Contact with Patient 02/19/11 1627      Chief Complaint  Patient presents with  . Cellulitis    (Consider location/radiation/quality/duration/timing/severity/associated sxs/prior treatment) HPI Level 5 Caveat applies for altered mental status.  Per EMS:  Patient with swelling of left wrist.  Patient resides in skilled nursing facility [facility]).  Past Medical History  Diagnosis Date  . History of DVT (deep vein thrombosis)     Old LE DVT per doppler 4/12  . Depression   . Dysphagia     Schatzki ring dilatation 07/2006  . Diverticulosis   . Hypertension   . Dementia   . Tachycardia   . Stroke   . Rhabdomyolysis     05/2010 after being found down at home.  . ARF (acute renal failure)     05/2010. Resolved.  Marland Kitchen PNA (pneumonia)   . CKD (chronic kidney disease), stage II   . Hemorrhoids   . UTI (lower urinary tract infection)   . Urinary retention     Hx of in 05/2010.  Marland Kitchen Near syncope   . Constipation   . Depression with anxiety 09/30/2010  . Cellulitis   . Hematuria   . Anxiety     Past Surgical History  Procedure Date  . Hemorroidectomy   . Ovarian cyst removal   . Total hip arthroplasty   . Lumbar back surgery     2004.    History reviewed. No pertinent family history.  History  Substance Use Topics  . Smoking status: Never Smoker   . Smokeless tobacco: Not on file  . Alcohol Use: No    OB History    Grav Para Term Preterm Abortions TAB SAB Ect Mult Living   1 1 1       1       Review of Systems  Unable to perform ROS: Mental status change    Allergies  Hydrocodone and Strawberry  Home Medications   Current Outpatient Rx  Name Route Sig Dispense Refill  . ALPRAZOLAM 1 MG PO TABS Oral Take 0.5-1 mg by mouth 3 (three) times daily.  Patient takes 1/2 tablet at (8am), 1 tablet at (2pm), 1 tablet at (8pm)    . PRO-STAT 101 PO LIQD Oral Take 30 mLs by mouth 2 (two) times daily.      . ASPIRIN EC 81 MG PO TBEC Oral Take 81 mg by mouth daily.      Marland Kitchen DOCUSATE SODIUM 100 MG PO CAPS Oral Take 100 mg by mouth 2 (two) times daily.      Marland Kitchen LORATADINE 10 MG PO TABS Oral Take 10 mg by mouth daily. Patient takes at 2pm (Patient started on 02/15/11)    . QUETIAPINE FUMARATE 25 MG PO TABS Oral Take 25 mg by mouth 2 (two) times daily.      . SERTRALINE HCL 50 MG PO TABS Oral Take 50 mg by mouth every morning.     Marland Kitchen AMOXICILLIN-POT CLAVULANATE 875-125 MG PO TABS Oral Take 1 tablet by mouth 2 (two) times daily. 20 tablet 0  . BISACODYL 10 MG RE SUPP Rectal Place 10 mg rectally as needed. For constipation     . SENNOSIDES-DOCUSATE SODIUM 8.6-50 MG PO TABS Oral Take 1 tablet by mouth 2 (  two) times daily.        BP 142/68  Pulse 92  Temp(Src) 99.5 F (37.5 C) (Oral)  Resp 22  Ht 5\' 1"  (1.549 m)  Wt 110 lb (49.896 kg)  BMI 20.78 kg/m2  SpO2 98%  Physical Exam  Constitutional: She appears well-developed.  HENT:  Head: Normocephalic and atraumatic.  Eyes: Conjunctivae are normal. Pupils are equal, round, and reactive to light.  Neck:       Right laterally bent , resists motion to the left.  Neck is non tender.    Cardiovascular: Tachycardia present.   No murmur heard. Pulmonary/Chest: Effort normal and breath sounds normal. No respiratory distress.  Abdominal: She exhibits no mass. There is no tenderness.  Musculoskeletal:       Thoracic back: She exhibits no tenderness.       Lumbar back: She exhibits no tenderness.       Left arm tender in wrist and forearm, left wrist tender and swollen primarily dorsally- with redness. Mild tenderness and redness of right  volar forearm, no lymphatagitis, no lymph nodes swollen of elbow or axilla. No deformity or tenderness of other extremities. She tends to hold both wrists flexed at about 45, and  resists motion of extension and flexion of the left wrist. Apparently due to pain.  Neurological:       Cooperates and responds to simple commands appropriately, alert, mild spasticity of arms and legs bilaterally.  No abnormal tone.   Skin: Skin is warm and dry. No rash noted.       No bed sores, buttocks clear  Psychiatric: Her behavior is normal.    ED Course  Procedures (including critical care time)   DIAGNOSTIC STUDIES: Oxygen Saturation is 100% on room air, normal by my interpretation.     COORDINATION OF CARE: 4:39 PM  Patient is febrile.  Physical exam complete.   7:22 PM  Recheck.  Patient temperature has decline mildly.  Advised patient of urinary tract infection.  Will treat with antibiotics.     Plan: Discharge back to skilled nursing facility. She is to take Tylenol every 4 or fever. Prescription for Augmentin 875 mg twice a day x10 days. Augmentin was chosen for its well-known urinary coverage, as well as the small possibility of cellulitis in the left wrist.  LABS / RADIOLOGY:   Labs Reviewed  CBC - Abnormal; Notable for the following:    Hemoglobin 10.9 (*)    HCT 33.8 (*)    All other components within normal limits  BASIC METABOLIC PANEL - Abnormal; Notable for the following:    Glucose, Bld 128 (*)    GFR calc non Af Amer 65 (*)    GFR calc Af Amer 75 (*)    All other components within normal limits  URINALYSIS, ROUTINE W REFLEX MICROSCOPIC - Abnormal; Notable for the following:    Hgb urine dipstick LARGE (*)    All other components within normal limits  SEDIMENTATION RATE - Abnormal; Notable for the following:    Sed Rate 54 (*)    All other components within normal limits  URINE MICROSCOPIC-ADD ON - Abnormal; Notable for the following:    Bacteria, UA FEW (*)    All other components within normal limits  DIFFERENTIAL  URINE CULTURE    Results for orders placed during the hospital encounter of 02/19/11  CBC      Component Value Range   WBC 9.4   4.0 - 10.5 (K/uL)   RBC  4.14  3.87 - 5.11 (MIL/uL)   Hemoglobin 10.9 (*) 12.0 - 15.0 (g/dL)   HCT 16.1 (*) 09.6 - 46.0 (%)   MCV 81.6  78.0 - 100.0 (fL)   MCH 26.3  26.0 - 34.0 (pg)   MCHC 32.2  30.0 - 36.0 (g/dL)   RDW 04.5  40.9 - 81.1 (%)   Platelets 203  150 - 400 (K/uL)  DIFFERENTIAL      Component Value Range   Neutrophils Relative 72  43 - 77 (%)   Neutro Abs 6.8  1.7 - 7.7 (K/uL)   Lymphocytes Relative 17  12 - 46 (%)   Lymphs Abs 1.6  0.7 - 4.0 (K/uL)   Monocytes Relative 10  3 - 12 (%)   Monocytes Absolute 0.9  0.1 - 1.0 (K/uL)   Eosinophils Relative 1  0 - 5 (%)   Eosinophils Absolute 0.1  0.0 - 0.7 (K/uL)   Basophils Relative 0  0 - 1 (%)   Basophils Absolute 0.0  0.0 - 0.1 (K/uL)  BASIC METABOLIC PANEL      Component Value Range   Sodium 139  135 - 145 (mEq/L)   Potassium 3.9  3.5 - 5.1 (mEq/L)   Chloride 104  96 - 112 (mEq/L)   CO2 27  19 - 32 (mEq/L)   Glucose, Bld 128 (*) 70 - 99 (mg/dL)   BUN 20  6 - 23 (mg/dL)   Creatinine, Ser 9.14  0.50 - 1.10 (mg/dL)   Calcium 8.9  8.4 - 78.2 (mg/dL)   GFR calc non Af Amer 65 (*) >90 (mL/min)   GFR calc Af Amer 75 (*) >90 (mL/min)  URINALYSIS, ROUTINE W REFLEX MICROSCOPIC      Component Value Range   Color, Urine YELLOW  YELLOW    APPearance CLEAR  CLEAR    Specific Gravity, Urine 1.025  1.005 - 1.030    pH 6.0  5.0 - 8.0    Glucose, UA NEGATIVE  NEGATIVE (mg/dL)   Hgb urine dipstick LARGE (*) NEGATIVE    Bilirubin Urine NEGATIVE  NEGATIVE    Ketones, ur NEGATIVE  NEGATIVE (mg/dL)   Protein, ur NEGATIVE  NEGATIVE (mg/dL)   Urobilinogen, UA 0.2  0.0 - 1.0 (mg/dL)   Nitrite NEGATIVE  NEGATIVE    Leukocytes, UA NEGATIVE  NEGATIVE   SEDIMENTATION RATE      Component Value Range   Sed Rate 54 (*) 0 - 22 (mm/hr)  URINE MICROSCOPIC-ADD ON      Component Value Range   WBC, UA 3-6  <3 (WBC/hpf)   RBC / HPF 7-10  <3 (RBC/hpf)   Bacteria, UA FEW (*) RARE     Dg Chest 1 View  02/19/2011  *RADIOLOGY REPORT*  Clinical  Data: Fever.  Tenderness in the wrist.  CHEST - 1 VIEW  Comparison: 09/30/2010  Findings: Heart size is upper limits normal.  The lungs are free of focal consolidations and pleural effusions.  There are biapical pleural parenchymal changes which appears stable.  No edema.  IMPRESSION: No evidence for acute cardiopulmonary abnormality.  Original Report Authenticated By: Patterson Hammersmith, M.D.   Dg Wrist Complete Left  02/19/2011  *RADIOLOGY REPORT*  Clinical Data: Fever.  Wrist tenderness.  Heat. 1.  LEFT WRIST - COMPLETE 3+ VIEW  Comparison: None  Findings: IV tubing overlies the ulna.  Bones appear demineralized. There is significant degenerative change in the wrist.  Significant osteoarthritis of the first carpometacarpal joint.  There is no  evidence for acute fracture or subluxation.  Note is made of chondrocalcinosis. In the interphalangeal joints of the digits, there are erosive osteoarthritis changes.  IMPRESSION: 1.  Chronic changes. 2.  No evidence for acute abnormality.  Original Report Authenticated By: Patterson Hammersmith, M.D.         MDM  Fever, and confusion, with urinary tract infection. Left wrist and forearm. Redness is possibly consistent with cellulitis. However, she has marked arthritis of the left wrist that could cellulitis as isolated redness of the wrist. Due to inflammation. The left forearm redness is very indistinct and does not clearly represent cellulitis.    8:31 PM Reevaluation with update and discussion. After initial assessment and treatment, an updated evaluation reveals temperature is improved. Patient is still confused.Mancel Bale L     MEDICATIONS GIVEN IN THE E.D. Scheduled Meds:    . acetaminophen  650 mg Rectal Once  . sodium chloride  500 mL Intravenous Once   Continuous Infusions:    . sodium chloride 125 mL/hr at 02/19/11 1858  . cefTRIAXone (ROCEPHIN)  IV         IMPRESSION: 1. Urinary tract infection   2. Arthritis       I  personally performed the services described in this documentation, which was scribed in my presence. The recorded information has been reviewed and considered.  Scribe          [name], MD 02/19/11 2032

## 2011-02-19 NOTE — ED Notes (Signed)
Pt arrived via ems d/t swelling of left wrist. Pt left red swollen and warm to touch.

## 2011-02-19 NOTE — ED Notes (Signed)
Awaiting [facility].

## 2011-02-19 NOTE — ED Notes (Signed)
[name], son, can be reached by cell. #[phone]

## 2011-02-19 NOTE — ED Notes (Signed)
Patient took two sips of water. Did not want any more at this time.

## 2011-02-25 ENCOUNTER — Telehealth (HOSPITAL_COMMUNITY): Payer: Self-pay | Admitting: *Deleted

## 2011-02-25 NOTE — ED Notes (Signed)
Chart returned from EDP office  On 18/18/2013. Rx written  For Macrobid 100 mg 1 po bid x 7 days. F/u with PCP-may need vancomycin

## 2011-02-25 NOTE — ED Notes (Signed)
Patient put on medication by PCP.

## 2011-02-25 NOTE — ED Notes (Signed)
Chart sent to EDP office for review °

## 2011-09-01 ENCOUNTER — Emergency Department (HOSPITAL_COMMUNITY)
Admission: EM | Admit: 2011-09-01 | Discharge: 2011-09-01 | Disposition: A | Discharge status: Home / Self Care | Payer: Medicare Other | Attending: Emergency Medicine | Admitting: Emergency Medicine

## 2011-09-01 ENCOUNTER — Encounter (HOSPITAL_COMMUNITY): Payer: Self-pay | Admitting: Emergency Medicine

## 2011-09-01 ENCOUNTER — Emergency Department (HOSPITAL_COMMUNITY): Payer: Medicare Other

## 2011-09-01 DIAGNOSIS — N182 Chronic kidney disease, stage 2 (mild): Secondary | ICD-10-CM | POA: Insufficient documentation

## 2011-09-01 DIAGNOSIS — R112 Nausea with vomiting, unspecified: Secondary | ICD-10-CM | POA: Insufficient documentation

## 2011-09-01 DIAGNOSIS — R6251 Failure to thrive (child): Secondary | ICD-10-CM

## 2011-09-01 DIAGNOSIS — Z8673 Personal history of transient ischemic attack (TIA), and cerebral infarction without residual deficits: Secondary | ICD-10-CM | POA: Insufficient documentation

## 2011-09-01 DIAGNOSIS — I129 Hypertensive chronic kidney disease with stage 1 through stage 4 chronic kidney disease, or unspecified chronic kidney disease: Secondary | ICD-10-CM | POA: Insufficient documentation

## 2011-09-01 DIAGNOSIS — F0390 Unspecified dementia without behavioral disturbance: Secondary | ICD-10-CM | POA: Insufficient documentation

## 2011-09-01 DIAGNOSIS — Z86718 Personal history of other venous thrombosis and embolism: Secondary | ICD-10-CM | POA: Insufficient documentation

## 2011-09-01 DIAGNOSIS — R5381 Other malaise: Secondary | ICD-10-CM | POA: Insufficient documentation

## 2011-09-01 LAB — CBC WITH DIFFERENTIAL/PLATELET
Eosinophils Absolute: 0.3 10*3/uL (ref 0.0–0.7)
Lymphocytes Relative: 19 % (ref 12–46)
Lymphs Abs: 1.9 10*3/uL (ref 0.7–4.0)
MCH: 29.5 pg (ref 26.0–34.0)
Neutrophils Relative %: 70 % (ref 43–77)
Platelets: 123 10*3/uL — ABNORMAL LOW (ref 150–400)
RBC: 3.69 MIL/uL — ABNORMAL LOW (ref 3.87–5.11)
WBC: 9.9 10*3/uL (ref 4.0–10.5)

## 2011-09-01 LAB — URINALYSIS, ROUTINE W REFLEX MICROSCOPIC
Leukocytes, UA: NEGATIVE
Nitrite: NEGATIVE
Specific Gravity, Urine: 1.025 (ref 1.005–1.030)
Urobilinogen, UA: 0.2 mg/dL (ref 0.0–1.0)

## 2011-09-01 LAB — URINE MICROSCOPIC-ADD ON

## 2011-09-01 LAB — BASIC METABOLIC PANEL
GFR calc Af Amer: 44 mL/min — ABNORMAL LOW (ref >90)
GFR calc non Af Amer: 38 mL/min — ABNORMAL LOW (ref >90)
Glucose, Bld: 108 mg/dL — ABNORMAL HIGH (ref 70–99)
Potassium: 4.1 mEq/L (ref 3.5–5.1)
Sodium: 136 mEq/L (ref 135–145)

## 2011-09-01 MED ORDER — SODIUM CHLORIDE 0.9 % IV BOLUS (SEPSIS)
500.0000 mL | Freq: Once | INTRAVENOUS | Status: AC
  Administered 2011-09-01: 500 mL via INTRAVENOUS

## 2011-09-01 MED ORDER — FLUCONAZOLE 200 MG PO TABS: 200.0000 mg | ORAL_TABLET | Freq: Every day | ORAL | Status: AC

## 2011-09-01 MED ORDER — SODIUM CHLORIDE 0.9 % IV SOLN: INTRAVENOUS | Status: DC

## 2011-09-01 NOTE — ED Notes (Signed)
Pt brought in by caregiver due to increased weakness since yesterday. States that she is very shaky when standing. Pt alert but confused. Skin warm and dry. Color pink. Breath sounds clear and equal bilaterally. No c/o pain. Able to move all extremities.

## 2011-09-01 NOTE — ED Notes (Signed)
Pt caregiver noticed increase weakness and possible UTI Tuesday. Pt eating and drinking but having some n/v

## 2011-09-01 NOTE — ED Notes (Signed)
I spoke with Harriett Sine, Pt's caregiver, and notified her that patient was being discharged. She asked me to call [facility] for discharge. I notified them and they will send a driver.

## 2011-09-01 NOTE — ED Provider Notes (Signed)
History  This chart was scribed for Donnetta Hutching, MD by Erskine Emery. This patient was seen in room APA03/APA03 and the patient's care was started at 11:40.   CSN: [id number]  Arrival date & time 09/01/11  1117   First MD Initiated Contact with Patient 09/01/11 1140     Level 5 Caveat--Dementia  Chief Complaint  Patient presents with  . Fatigue  . Urinary Tract Infection  . Nausea  . Emesis    (Consider location/radiation/quality/duration/timing/severity/associated sxs/prior treatment) HPI [name] is a 78 y.o. female who presents to the Emergency Department because her caretaker noticed abnormal behavior including increased fatigue. Pt's caretaker reports on Tuesday afternoon she was abnormally anxious, then yesterday slept all day and all night and had a mild fever with chills. Pt was given an antibiotic (cephalexin 250 bid) because of a suspected UTI (pt has a h/o multiple UTIs). Pt's caretaker denies any appetite change or lack of fluids.  Pt lives at [facility] assisted [facility] and has moderate dementia.    Past Medical History  Diagnosis Date  . History of DVT (deep vein thrombosis)     Old LE DVT per doppler 4/12  . Depression   . Dysphagia     Schatzki ring dilatation 07/2006  . Diverticulosis   . Hypertension   . Dementia   . Tachycardia   . Stroke   . Rhabdomyolysis     05/2010 after being found down at home.  . ARF (acute renal failure)     05/2010. Resolved.  Marland Kitchen PNA (pneumonia)   . CKD (chronic kidney disease), stage II   . Hemorrhoids   . UTI (lower urinary tract infection)   . Urinary retention     Hx of in 05/2010.  Marland Kitchen Near syncope   . Constipation   . Depression with anxiety 09/30/2010  . Cellulitis   . Hematuria   . Anxiety     Past Surgical History  Procedure Date  . Hemorroidectomy   . Ovarian cyst removal   . Total hip arthroplasty   . Lumbar back surgery     2004.    History reviewed. No pertinent family history.  History    Substance Use Topics  . Smoking status: Never Smoker   . Smokeless tobacco: Not on file  . Alcohol Use: No    OB History    Grav Para Term Preterm Abortions TAB SAB Ect Mult Living   1 1 1       1       Review of Systems  Constitutional: Positive for fever and chills. Negative for appetite change.  Respiratory: Negative for shortness of breath.   Gastrointestinal: Negative for nausea and vomiting.  Genitourinary: Positive for difficulty urinating.  Neurological: Positive for weakness.    Allergies  Hydrocodone and Strawberry  Home Medications   Current Outpatient Rx  Name Route Sig Dispense Refill  . ALPRAZOLAM 1 MG PO TABS Oral Take 0.5-1 mg by mouth 3 (three) times daily. Patient takes 1/2 tablet at (8am), 1 tablet at (2pm), 1 tablet at (8pm)    . PRO-STAT 101 PO LIQD Oral Take 30 mLs by mouth 2 (two) times daily.      . ASPIRIN EC 81 MG PO TBEC Oral Take 81 mg by mouth daily.      Marland Kitchen BISACODYL 10 MG RE SUPP Rectal Place 10 mg rectally as needed. For constipation     . DOCUSATE SODIUM 100 MG PO CAPS Oral Take 100 mg by  mouth 2 (two) times daily.      Marland Kitchen LORATADINE 10 MG PO TABS Oral Take 10 mg by mouth daily. Patient takes at 2pm (Patient started on 02/15/11)    . QUETIAPINE FUMARATE 25 MG PO TABS Oral Take 25 mg by mouth 2 (two) times daily.      Bernadette Hoit SODIUM 8.6-50 MG PO TABS Oral Take 1 tablet by mouth 2 (two) times daily.      . SERTRALINE HCL 50 MG PO TABS Oral Take 50 mg by mouth every morning.       Triage Vitals: BP 115/44  Pulse 78  Temp 98.7 F (37.1 C) (Oral)  Resp 18  Ht 5\' 6"  (1.676 m)  Wt 110 lb (49.896 kg)  BMI 17.75 kg/m2  SpO2 99%  Physical Exam  Nursing note and vitals reviewed. Constitutional: She is oriented to person, place, and time. She appears well-developed and well-nourished.       Sleeping soundly and looks dehydrated.   HENT:  Head: Normocephalic and atraumatic.  Eyes: Conjunctivae and EOM are normal. Pupils are equal,  round, and reactive to light.  Neck: Normal range of motion. Neck supple. No tracheal deviation present.  Cardiovascular: Normal rate, regular rhythm and normal heart sounds.   No murmur heard. Pulmonary/Chest: Effort normal and breath sounds normal.  Abdominal: Soft. Bowel sounds are normal.  Genitourinary:       White cheesy vaginal discharge  Musculoskeletal: Normal range of motion.  Neurological: She is alert and oriented to person, place, and time.  Skin: Skin is warm and dry.  Psychiatric: She has a normal mood and affect.       demented    ED Course  Procedures (including critical care time) DIAGNOSTIC STUDIES: Oxygen Saturation is 99% on room air, normal by my interpretation.    COORDINATION OF CARE: 12:00--I evaluated the patient who was sleeping soundly and looked dehydrated. I discussed a treatment plan including urinalysis, blood work, IV fluids, and possible admission with her caretaker who agreed.   12:30--Medication orders: sodium chloride 0.9% bolus 500 mL--once   0.9% sodium chloride infusion--continuous  Labs Reviewed - No data to display No results found.   No diagnosis found.    MDM   Urinalysis and chest x-ray normal.  Patient feeling better after IV fluids. We'll discharge home with Diflucan.      I personally performed the services described in this documentation, which was scribed in my presence. The recorded information has been reviewed and considered.          Donnetta Hutching, MD 09/01/11 [phone]

## 2011-09-01 NOTE — ED Notes (Signed)
IV discontinued.

## 2012-10-07 ENCOUNTER — Emergency Department (HOSPITAL_COMMUNITY): Payer: Medicare Other

## 2012-10-07 ENCOUNTER — Other Ambulatory Visit: Payer: Self-pay

## 2012-10-07 ENCOUNTER — Emergency Department (HOSPITAL_COMMUNITY)
Admission: EM | Admit: 2012-10-07 | Discharge: 2012-10-07 | Disposition: A | Discharge status: Home / Self Care | Payer: Medicare Other | Attending: Emergency Medicine | Admitting: Emergency Medicine

## 2012-10-07 ENCOUNTER — Encounter (HOSPITAL_COMMUNITY): Payer: Self-pay | Admitting: *Deleted

## 2012-10-07 DIAGNOSIS — R5381 Other malaise: Secondary | ICD-10-CM | POA: Insufficient documentation

## 2012-10-07 DIAGNOSIS — Z7982 Long term (current) use of aspirin: Secondary | ICD-10-CM | POA: Insufficient documentation

## 2012-10-07 DIAGNOSIS — I129 Hypertensive chronic kidney disease with stage 1 through stage 4 chronic kidney disease, or unspecified chronic kidney disease: Secondary | ICD-10-CM | POA: Insufficient documentation

## 2012-10-07 DIAGNOSIS — F341 Dysthymic disorder: Secondary | ICD-10-CM | POA: Insufficient documentation

## 2012-10-07 DIAGNOSIS — Z87440 Personal history of urinary (tract) infections: Secondary | ICD-10-CM | POA: Insufficient documentation

## 2012-10-07 DIAGNOSIS — Z86718 Personal history of other venous thrombosis and embolism: Secondary | ICD-10-CM | POA: Insufficient documentation

## 2012-10-07 DIAGNOSIS — F0390 Unspecified dementia without behavioral disturbance: Secondary | ICD-10-CM | POA: Insufficient documentation

## 2012-10-07 DIAGNOSIS — Z791 Long term (current) use of non-steroidal anti-inflammatories (NSAID): Secondary | ICD-10-CM | POA: Insufficient documentation

## 2012-10-07 DIAGNOSIS — R059 Cough, unspecified: Secondary | ICD-10-CM | POA: Insufficient documentation

## 2012-10-07 DIAGNOSIS — R05 Cough: Secondary | ICD-10-CM

## 2012-10-07 DIAGNOSIS — Z8673 Personal history of transient ischemic attack (TIA), and cerebral infarction without residual deficits: Secondary | ICD-10-CM | POA: Insufficient documentation

## 2012-10-07 DIAGNOSIS — N182 Chronic kidney disease, stage 2 (mild): Secondary | ICD-10-CM | POA: Insufficient documentation

## 2012-10-07 DIAGNOSIS — Z79899 Other long term (current) drug therapy: Secondary | ICD-10-CM | POA: Insufficient documentation

## 2012-10-07 DIAGNOSIS — Z8739 Personal history of other diseases of the musculoskeletal system and connective tissue: Secondary | ICD-10-CM | POA: Insufficient documentation

## 2012-10-07 DIAGNOSIS — Z792 Long term (current) use of antibiotics: Secondary | ICD-10-CM | POA: Insufficient documentation

## 2012-10-07 DIAGNOSIS — Z872 Personal history of diseases of the skin and subcutaneous tissue: Secondary | ICD-10-CM | POA: Insufficient documentation

## 2012-10-07 DIAGNOSIS — Z8719 Personal history of other diseases of the digestive system: Secondary | ICD-10-CM | POA: Insufficient documentation

## 2012-10-07 DIAGNOSIS — Z8701 Personal history of pneumonia (recurrent): Secondary | ICD-10-CM | POA: Insufficient documentation

## 2012-10-07 DIAGNOSIS — Z96649 Presence of unspecified artificial hip joint: Secondary | ICD-10-CM | POA: Insufficient documentation

## 2012-10-07 LAB — URINALYSIS, ROUTINE W REFLEX MICROSCOPIC
Glucose, UA: NEGATIVE mg/dL
Ketones, ur: NEGATIVE mg/dL
Leukocytes, UA: NEGATIVE
Nitrite: NEGATIVE
pH: 6 (ref 5.0–8.0)

## 2012-10-07 LAB — URINE MICROSCOPIC-ADD ON

## 2012-10-07 MED ORDER — HYDROCOD POLST-CHLORPHEN POLST 10-8 MG/5ML PO LQCR: 2.5000 mL | Freq: Two times a day (BID) | ORAL | Status: DC

## 2012-10-07 NOTE — ED Notes (Signed)
Cough and weakness x 2 days.  Caregiver had her sent to urgent care Friday, felt nothing was done.  Wanted her seen in ER b/c she felt "she had pneumonia".

## 2012-10-07 NOTE — ED Provider Notes (Signed)
CSN: [id number]     Arrival date & time 10/07/12  1356 History  This chart was scribed for Donnetta Hutching, MD by Caryn Bee, ED Scribe and Greggory Stallion, ED Scribe. This patient was seen in room APA06/APA06 and the patient's care was started at 2:11 PM.    Chief Complaint  Patient presents with  . Cough  . Weakness    The history is provided by a caregiver. No language interpreter was used.   Level 5 Caveat: Dementia HPI Comments: [name] is a 79 y.o. female with dementia who presents to the Emergency Department complaining of intermittent coughing that started 2 days ago. Pt had a cold 2 weeks ago and was prescribed a Z pack. Caretaker reports that she was taken to urgent care one week ago and no x-ray was done. Patient has been eating normally. No fever, chills, change in behavior.   Severity is mild.   Caregiver also concerned about the possibility of a urinary tract infection    Past Medical History  Diagnosis Date  . History of DVT (deep vein thrombosis)     Old LE DVT per doppler 4/12  . Depression   . Dysphagia     Schatzki ring dilatation 07/2006  . Diverticulosis   . Hypertension   . Dementia   . Tachycardia   . Stroke   . Rhabdomyolysis     05/2010 after being found down at home.  . ARF (acute renal failure)     05/2010. Resolved.  Marland Kitchen PNA (pneumonia)   . CKD (chronic kidney disease), stage II   . Hemorrhoids   . UTI (lower urinary tract infection)   . Urinary retention     Hx of in 05/2010.  Marland Kitchen Near syncope   . Constipation   . Depression with anxiety 09/30/2010  . Cellulitis   . Hematuria   . Anxiety    Past Surgical History  Procedure Laterality Date  . Hemorroidectomy    . Ovarian cyst removal    . Total hip arthroplasty    . Lumbar back surgery      2004.   History reviewed. No pertinent family history. History  Substance Use Topics  . Smoking status: Never Smoker   . Smokeless tobacco: Not on file  . Alcohol Use: No   OB History   Grav  Para Term Preterm Abortions TAB SAB Ect Mult Living   1 1 1       1      Review of Systems A complete 10 system review of systems was obtained and all systems are negative except as noted in the HPI and PMH.    Allergies  Hydrocodone and Strawberry  Home Medications   Current Outpatient Rx  Name  Route  Sig  Dispense  Refill  . acetaminophen (TYLENOL) 500 MG tablet   Oral   Take 500 mg by mouth 3 (three) times daily.         Marland Kitchen ALPRAZolam (XANAX) 1 MG tablet   Oral   Take 0.5-1 mg by mouth 3 (three) times daily. Patient takes 1/2 tablet at (8am), 1 tablet at (2pm), 1 tablet at (8pm)         . Amino Acids-Protein Hydrolys (PRO-STAT 101) LIQD   Oral   Take 30 mLs by mouth 2 (two) times daily.           Marland Kitchen aspirin EC 81 MG tablet   Oral   Take 81 mg by mouth daily.           Marland Kitchen  cephALEXin (KEFLEX) 250 MG capsule   Oral   Take 250 mg by mouth 2 (two) times daily.         Marland Kitchen docusate sodium (COLACE) 100 MG capsule   Oral   Take 100 mg by mouth 2 (two) times daily.           Marland Kitchen loratadine (CLARITIN) 10 MG tablet   Oral   Take 10 mg by mouth daily. Patient takes at 2pm (Patient started on 02/15/11)         . meloxicam (MOBIC) 7.5 MG tablet   Oral   Take 7.5 mg by mouth 2 (two) times daily.         . QUEtiapine (SEROQUEL) 25 MG tablet   Oral   Take 25 mg by mouth 2 (two) times daily.           Marland Kitchen senna (SENNA LAXATIVE) 8.6 MG tablet   Oral   Take 1 tablet by mouth daily.         . sertraline (ZOLOFT) 50 MG tablet   Oral   Take 50 mg by mouth every morning.           BP 129/49  Temp(Src) 99 F (37.2 C) (Oral)  Resp 12  SpO2 100% Physical Exam  Nursing note and vitals reviewed. Constitutional: She is oriented to person, place, and time. She appears well-developed and well-nourished.  HENT:  Head: Normocephalic and atraumatic.  Eyes: Conjunctivae and EOM are normal. Pupils are equal, round, and reactive to light.  Neck: Normal range of motion.  Neck supple.  Cardiovascular: Normal rate, regular rhythm and normal heart sounds.   Pulmonary/Chest: Effort normal and breath sounds normal. No respiratory distress. She has no wheezes. She has no rales.  Abdominal: Soft. Bowel sounds are normal.  Musculoskeletal: Normal range of motion.  Neurological: She is alert and oriented to person, place, and time.  Skin: Skin is warm and dry.  Psychiatric: She has a normal mood and affect.    ED Course  Procedures (including critical care time) DIAGNOSTIC STUDIES: Oxygen Saturation is 100% on room air, normal by my interpretation.    COORDINATION OF CARE: 2:20 PM-Discussed treatment plan which includes chest x-ray and UA with pt at bedside and pt agreed to plan.   Labs Review Labs Reviewed  GLUCOSE, CAPILLARY - Abnormal; Notable for the following:    Glucose-Capillary 102 (*)    All other components within normal limits  URINALYSIS, ROUTINE W REFLEX MICROSCOPIC - Abnormal; Notable for the following:    Hgb urine dipstick SMALL (*)    All other components within normal limits  URINE MICROSCOPIC-ADD ON   Imaging Review Dg Chest Portable 1 View  10/07/2012   *RADIOLOGY REPORT*  Clinical Data: Cough, fever and weakness.  PORTABLE CHEST - 1 VIEW  Comparison: 09/01/2011  Findings: No focal pulmonary consolidation is seen.  There likely is a small left pleural effusion.  No pulmonary edema is identified.  Heart size and mediastinal contours are within normal limits.  IMPRESSION: Probable small left pleural effusion.  No focal infiltrate is seen.   Original Report Authenticated By: Irish Lack, M.D.    Date: 10/07/2012  Rate: 78  Rhythm: normal sinus rhythm  QRS Axis: left  Intervals: normal  ST/T Wave abnormalities: normal  Conduction Disutrbances: none  Narrative Interpretation: unremarkable    MDM  No diagnosis found. Chest x-ray and urinalysis showed no infection.   Rx Tussionex, recommend "THICK IT" feedings. Patient has primary  care follow up  I personally performed the services described in this documentation, which was scribed in my presence. The recorded information has been reviewed and is accurate.     Donnetta Hutching, MD 10/07/12 [phone]

## 2012-10-07 NOTE — ED Notes (Signed)
Patient with no complaints at this time. Respirations even and unlabored. Skin warm/dry. Discharge instructions reviewed with patient at this time. Patient given opportunity to voice concerns/ask questions. Patient discharged at this time and left Emergency Department with steady gait.   

## 2012-10-07 NOTE — ED Notes (Signed)
Patient is non-verbal.  Arms slightly contracted medially, legs extended.  Lungs clear bilaterally.  Skin intact.

## 2012-10-22 ENCOUNTER — Emergency Department (HOSPITAL_COMMUNITY)
Admission: EM | Admit: 2012-10-22 | Discharge: 2012-10-22 | Disposition: A | Discharge status: Home / Self Care | Payer: Medicare Other | Attending: Emergency Medicine | Admitting: Emergency Medicine

## 2012-10-22 ENCOUNTER — Encounter (HOSPITAL_COMMUNITY): Payer: Self-pay

## 2012-10-22 DIAGNOSIS — F341 Dysthymic disorder: Secondary | ICD-10-CM | POA: Insufficient documentation

## 2012-10-22 DIAGNOSIS — Z79899 Other long term (current) drug therapy: Secondary | ICD-10-CM | POA: Insufficient documentation

## 2012-10-22 DIAGNOSIS — F0390 Unspecified dementia without behavioral disturbance: Secondary | ICD-10-CM | POA: Insufficient documentation

## 2012-10-22 DIAGNOSIS — Z8673 Personal history of transient ischemic attack (TIA), and cerebral infarction without residual deficits: Secondary | ICD-10-CM | POA: Insufficient documentation

## 2012-10-22 DIAGNOSIS — Z791 Long term (current) use of non-steroidal anti-inflammatories (NSAID): Secondary | ICD-10-CM | POA: Insufficient documentation

## 2012-10-22 DIAGNOSIS — N182 Chronic kidney disease, stage 2 (mild): Secondary | ICD-10-CM | POA: Insufficient documentation

## 2012-10-22 DIAGNOSIS — Z86718 Personal history of other venous thrombosis and embolism: Secondary | ICD-10-CM | POA: Insufficient documentation

## 2012-10-22 DIAGNOSIS — N390 Urinary tract infection, site not specified: Secondary | ICD-10-CM | POA: Insufficient documentation

## 2012-10-22 DIAGNOSIS — I129 Hypertensive chronic kidney disease with stage 1 through stage 4 chronic kidney disease, or unspecified chronic kidney disease: Secondary | ICD-10-CM | POA: Insufficient documentation

## 2012-10-22 DIAGNOSIS — K5900 Constipation, unspecified: Secondary | ICD-10-CM | POA: Insufficient documentation

## 2012-10-22 DIAGNOSIS — Z872 Personal history of diseases of the skin and subcutaneous tissue: Secondary | ICD-10-CM | POA: Insufficient documentation

## 2012-10-22 DIAGNOSIS — Z792 Long term (current) use of antibiotics: Secondary | ICD-10-CM | POA: Insufficient documentation

## 2012-10-22 DIAGNOSIS — Z8701 Personal history of pneumonia (recurrent): Secondary | ICD-10-CM | POA: Insufficient documentation

## 2012-10-22 DIAGNOSIS — Z7982 Long term (current) use of aspirin: Secondary | ICD-10-CM | POA: Insufficient documentation

## 2012-10-22 DIAGNOSIS — Z8739 Personal history of other diseases of the musculoskeletal system and connective tissue: Secondary | ICD-10-CM | POA: Insufficient documentation

## 2012-10-22 LAB — BASIC METABOLIC PANEL
BUN: 19 mg/dL (ref 6–23)
Creatinine, Ser: 1.41 mg/dL — ABNORMAL HIGH (ref 0.50–1.10)
GFR calc Af Amer: 40 mL/min — ABNORMAL LOW (ref >90)
GFR calc non Af Amer: 34 mL/min — ABNORMAL LOW (ref >90)

## 2012-10-22 LAB — CBC WITH DIFFERENTIAL/PLATELET
Basophils Absolute: 0 10*3/uL (ref 0.0–0.1)
Basophils Relative: 0 % (ref 0–1)
Eosinophils Absolute: 0.3 10*3/uL (ref 0.0–0.7)
HCT: 35.1 % — ABNORMAL LOW (ref 36.0–46.0)
Hemoglobin: 11.3 g/dL — ABNORMAL LOW (ref 12.0–15.0)
MCH: 29.1 pg (ref 26.0–34.0)
MCHC: 32.2 g/dL (ref 30.0–36.0)
Monocytes Absolute: 0.5 10*3/uL (ref 0.1–1.0)
Monocytes Relative: 8 % (ref 3–12)
Neutrophils Relative %: 63 % (ref 43–77)
RDW: 13.8 % (ref 11.5–15.5)

## 2012-10-22 LAB — URINALYSIS, ROUTINE W REFLEX MICROSCOPIC
Bilirubin Urine: NEGATIVE
Ketones, ur: NEGATIVE mg/dL
Nitrite: NEGATIVE
Protein, ur: 30 mg/dL — AB
Urobilinogen, UA: 0.2 mg/dL (ref 0.0–1.0)

## 2012-10-22 MED ORDER — FLUCONAZOLE 40 MG/ML PO SUSR
200.0000 mg | Freq: Every day | ORAL | Status: DC
  Filled 2012-10-22 (×3): qty 5

## 2012-10-22 MED ORDER — CIPROFLOXACIN 500 MG/5ML (10%) PO SUSR: 500.0000 mg | Freq: Two times a day (BID) | ORAL | Status: DC

## 2012-10-22 MED ORDER — CEFTRIAXONE SODIUM 1 G IJ SOLR
1.0000 g | Freq: Once | INTRAMUSCULAR | Status: AC
  Administered 2012-10-22: 1 g via INTRAMUSCULAR
  Filled 2012-10-22: qty 10

## 2012-10-22 MED ORDER — FLUCONAZOLE 40 MG/ML PO SUSR: ORAL | Status: DC

## 2012-10-22 NOTE — ED Notes (Signed)
Called [facility]. They stated there are 3 patient transports pending at this time and there are only 3 trucks in the county at this time. Patient awaiting transport back to [facility] at this time.

## 2012-10-22 NOTE — ED Notes (Signed)
Family informed of delay.

## 2012-10-22 NOTE — ED Notes (Signed)
Patient dressed for transport back to [facility] home.

## 2012-10-22 NOTE — ED Notes (Signed)
Pt resident of [facility].  Reports saw pinkish discharge in diaper.  Unsure where blood is coming from.  Staff told EMS they also noticed it when she voided.  Pt alert to self.  EMS says pt has been "mumbling."  EMS reports does not think that the "mumbling" is new because staff did not mention it to them.

## 2012-10-22 NOTE — ED Provider Notes (Signed)
CSN: [id number]     Arrival date & time 10/22/12  1717 History   First MD Initiated Contact with Patient 10/22/12 1758     Chief Complaint  Patient presents with  . bloody discharge in diaper    (Consider location/radiation/quality/duration/timing/severity/associated sxs/prior Treatment) Patient is a 79 y.o. female presenting with hematuria. The history is provided by a relative and the nursing home (pt having blood in urine).  Hematuria This is a new problem. The current episode started 2 days ago. The problem occurs constantly. The problem has not changed since onset.Pertinent negatives include no abdominal pain. Nothing aggravates the symptoms. Nothing relieves the symptoms.    Past Medical History  Diagnosis Date  . History of DVT (deep vein thrombosis)     Old LE DVT per doppler 4/12  . Depression   . Dysphagia     Schatzki ring dilatation 07/2006  . Diverticulosis   . Hypertension   . Dementia   . Tachycardia   . Stroke   . Rhabdomyolysis     05/2010 after being found down at home.  . ARF (acute renal failure)     05/2010. Resolved.  Marland Kitchen PNA (pneumonia)   . CKD (chronic kidney disease), stage II   . Hemorrhoids   . UTI (lower urinary tract infection)   . Urinary retention     Hx of in 05/2010.  Marland Kitchen Near syncope   . Constipation   . Depression with anxiety 09/30/2010  . Cellulitis   . Hematuria   . Anxiety    Past Surgical History  Procedure Laterality Date  . Hemorroidectomy    . Ovarian cyst removal    . Total hip arthroplasty    . Lumbar back surgery      2004.   No family history on file. History  Substance Use Topics  . Smoking status: Never Smoker   . Smokeless tobacco: Not on file  . Alcohol Use: No   OB History   Grav Para Term Preterm Abortions TAB SAB Ect Mult Living   1 1 1       1      Review of Systems  Unable to perform ROS: Dementia  HENT: Negative for sinus pressure.   Gastrointestinal: Negative for abdominal pain and diarrhea.   Genitourinary: Positive for hematuria.  Musculoskeletal: Negative for back pain.  Skin: Negative for rash.  Psychiatric/Behavioral: Negative for hallucinations.    Allergies  Hydrocodone and Strawberry  Home Medications   Current Outpatient Rx  Name  Route  Sig  Dispense  Refill  . acetaminophen (TYLENOL) 500 MG tablet   Oral   Take 500 mg by mouth 2 (two) times daily.         Marland Kitchen ALPRAZolam (XANAX) 0.5 MG tablet   Oral   Take 0.5 mg by mouth at bedtime.         Marland Kitchen aspirin EC 81 MG tablet   Oral   Take 81 mg by mouth daily.           . cephALEXin (KEFLEX) 250 MG capsule   Oral   Take 250 mg by mouth daily. Continuous for cellulitis         . docusate sodium (COLACE) 100 MG capsule   Oral   Take 100 mg by mouth 2 (two) times daily.           Marland Kitchen loratadine (CLARITIN) 10 MG tablet   Oral   Take 10 mg by mouth daily. Patient takes at 2pm (Patient  started on 02/15/11)         . meloxicam (MOBIC) 7.5 MG tablet   Oral   Take 7.5 mg by mouth 2 (two) times daily.         . QUEtiapine (SEROQUEL) 25 MG tablet   Oral   Take 25 mg by mouth 2 (two) times daily.           Marland Kitchen senna (SENNA LAXATIVE) 8.6 MG tablet   Oral   Take 1 tablet by mouth daily.         . sertraline (ZOLOFT) 50 MG tablet   Oral   Take 50 mg by mouth every morning.          . ciprofloxacin (CIPRO) 500 MG/5ML (10%) suspension   Oral   Take 5 mLs (500 mg total) by mouth 2 (two) times daily.   70 mL   0    BP 134/51  Pulse 78  Temp(Src) 98.2 F (36.8 C) (Oral)  Resp 20  SpO2 99% Physical Exam  Constitutional: She appears well-developed.  HENT:  Head: Normocephalic.  Eyes: Conjunctivae and EOM are normal. No scleral icterus.  Neck: Neck supple. No thyromegaly present.  Cardiovascular: Normal rate and regular rhythm.  Exam reveals no gallop and no friction rub.   No murmur heard. Pulmonary/Chest: No stridor. She has no wheezes. She has no rales. She exhibits no tenderness.   Abdominal: She exhibits no distension. There is no tenderness. There is no rebound.  Musculoskeletal: Normal range of motion. She exhibits no edema.  Lymphadenopathy:    She has no cervical adenopathy.  Neurological:  Alert not oriented to anything  Skin: No rash noted. No erythema.  Psychiatric: She has a normal mood and affect.    ED Course  Procedures (including critical care time) Labs Review Labs Reviewed  CBC WITH DIFFERENTIAL - Abnormal; Notable for the following:    Hemoglobin 11.3 (*)    HCT 35.1 (*)    All other components within normal limits  BASIC METABOLIC PANEL - Abnormal; Notable for the following:    Glucose, Bld 101 (*)    Creatinine, Ser 1.41 (*)    GFR calc non Af Amer 34 (*)    GFR calc Af Amer 40 (*)    All other components within normal limits  URINALYSIS, ROUTINE W REFLEX MICROSCOPIC - Abnormal; Notable for the following:    Color, Urine RED (*)    APPearance HAZY (*)    Specific Gravity, Urine <1.005 (*)    Hgb urine dipstick LARGE (*)    Protein, ur 30 (*)    Leukocytes, UA SMALL (*)    All other components within normal limits  URINE MICROSCOPIC-ADD ON - Abnormal; Notable for the following:    Bacteria, UA FEW (*)    All other components within normal limits  URINE CULTURE   Imaging Review No results found.  MDM   1. UTI (lower urinary tract infection)        Benny Lennert, MD 10/22/12 1945

## 2012-10-25 LAB — URINE CULTURE: Culture: NO GROWTH

## 2012-11-29 ENCOUNTER — Other Ambulatory Visit (HOSPITAL_COMMUNITY): Payer: Self-pay | Admitting: Urology

## 2012-11-29 DIAGNOSIS — N390 Urinary tract infection, site not specified: Secondary | ICD-10-CM

## 2012-11-29 DIAGNOSIS — R310 Gross hematuria: Secondary | ICD-10-CM

## 2012-12-03 IMAGING — CR DG CHEST 1V
1 series · 1 of 1 positions shown · non-contrast
Comparison: 07/21/2010

CLINICAL DATA: Chest pain, altered mental status

CHEST - 1 VIEW

[view not recorded]
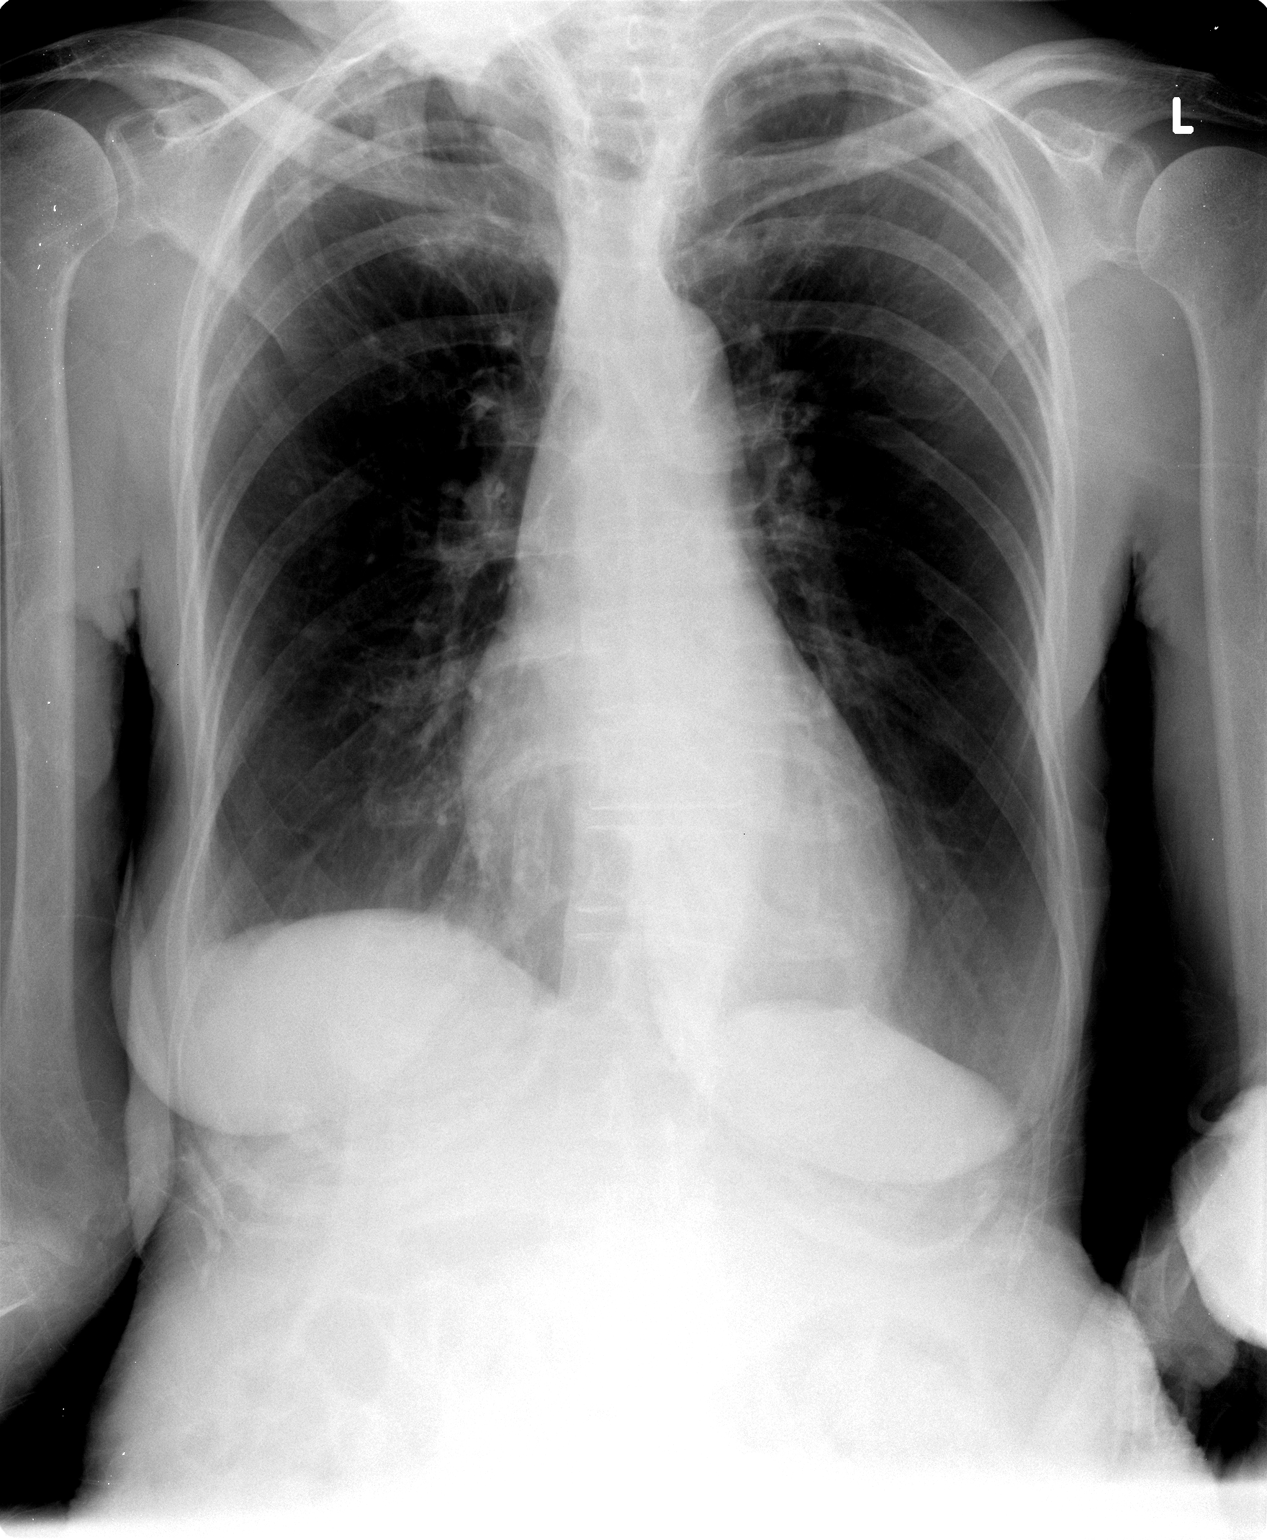

[1 of 1 positions shown; findings below may reference images not displayed]

FINDINGS: Upper normal heart size.
Minimal elongation and calcification of thoracic aorta.
Pulmonary vascularity normal.
Emphysematous changes with bilateral upper lobe scarring and volume
loss.
No pulmonary infiltrate, pleural effusion or pneumothorax.
Bones diffusely demineralized.
Mild broad-based levoconvex thoracic scoliosis.
IMPRESSION: Emphysematous changes with bilateral upper lobe scarring and volume
loss.
No acute abnormalities.

## 2012-12-03 IMAGING — CR DG HAND 2V*R*
2 series · 2 of 2 positions shown · non-contrast
Comparison: None

CLINICAL DATA: Hand pain, altered mental status

RIGHT HAND - 2 VIEW

[view not recorded (1 of 2)]
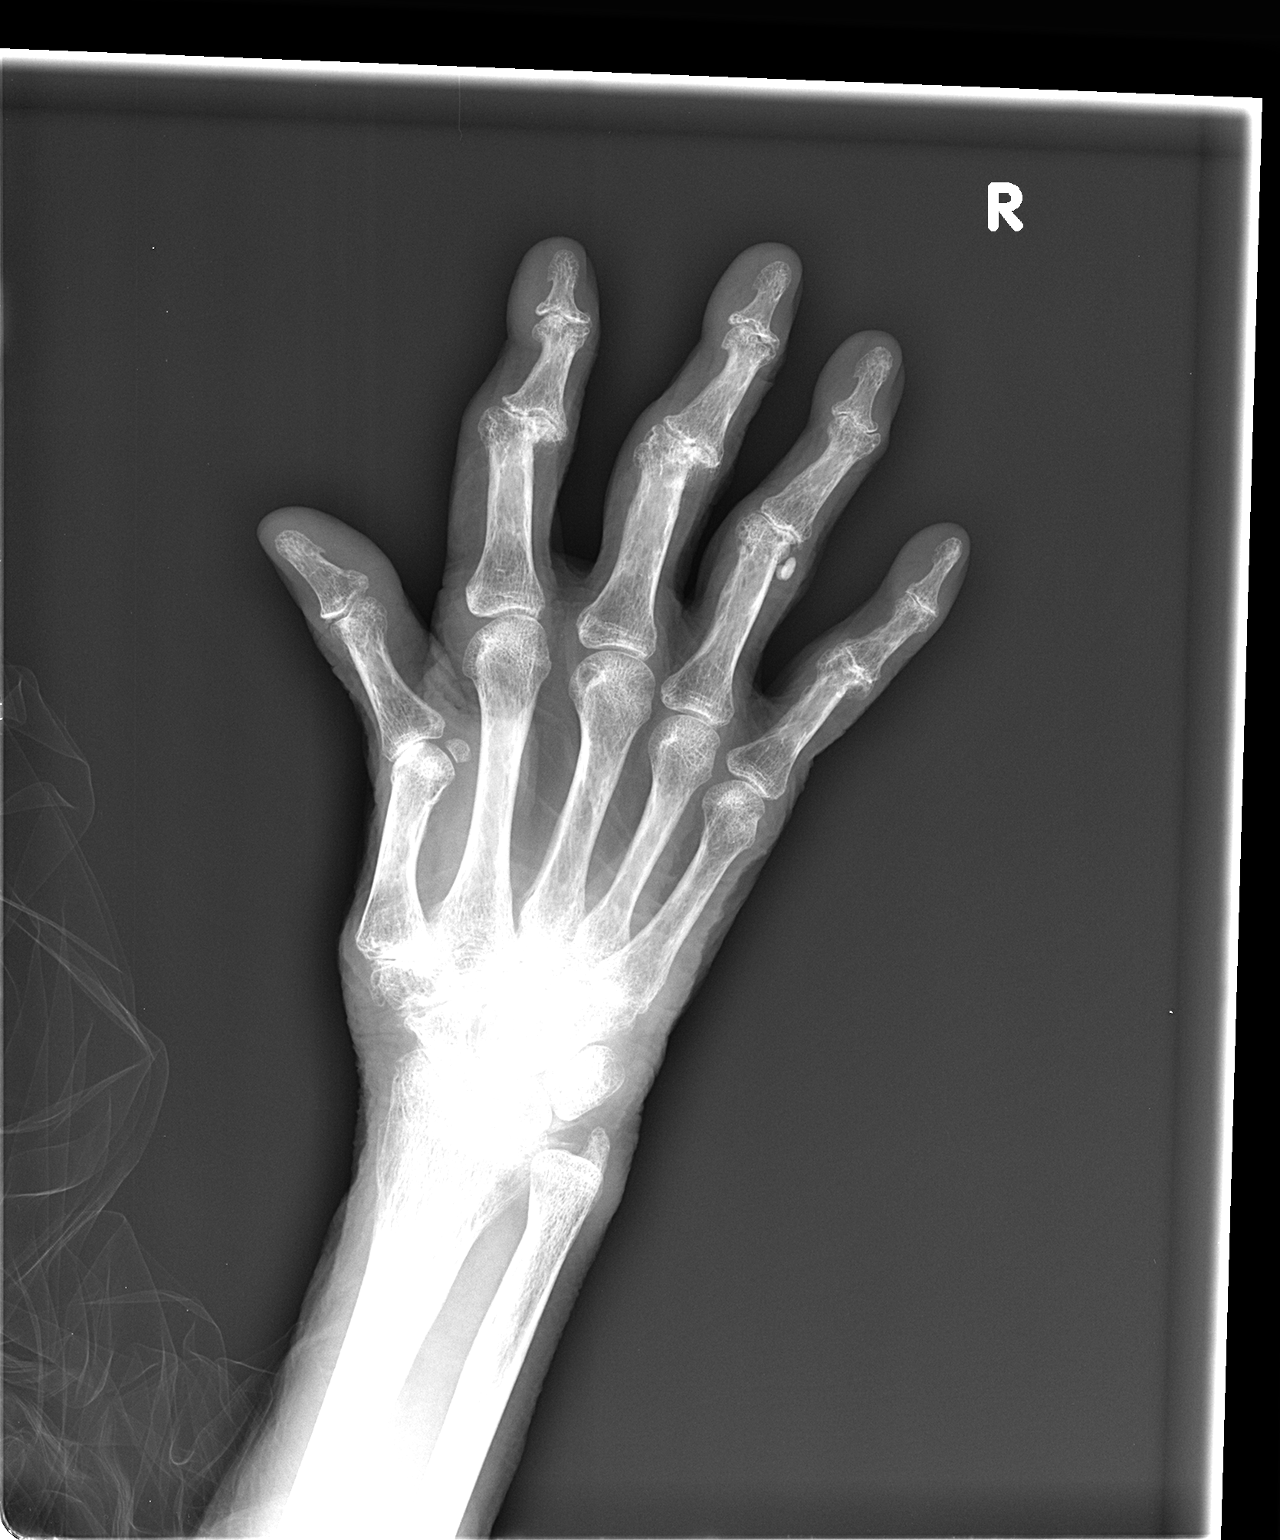

[view not recorded (2 of 2)]
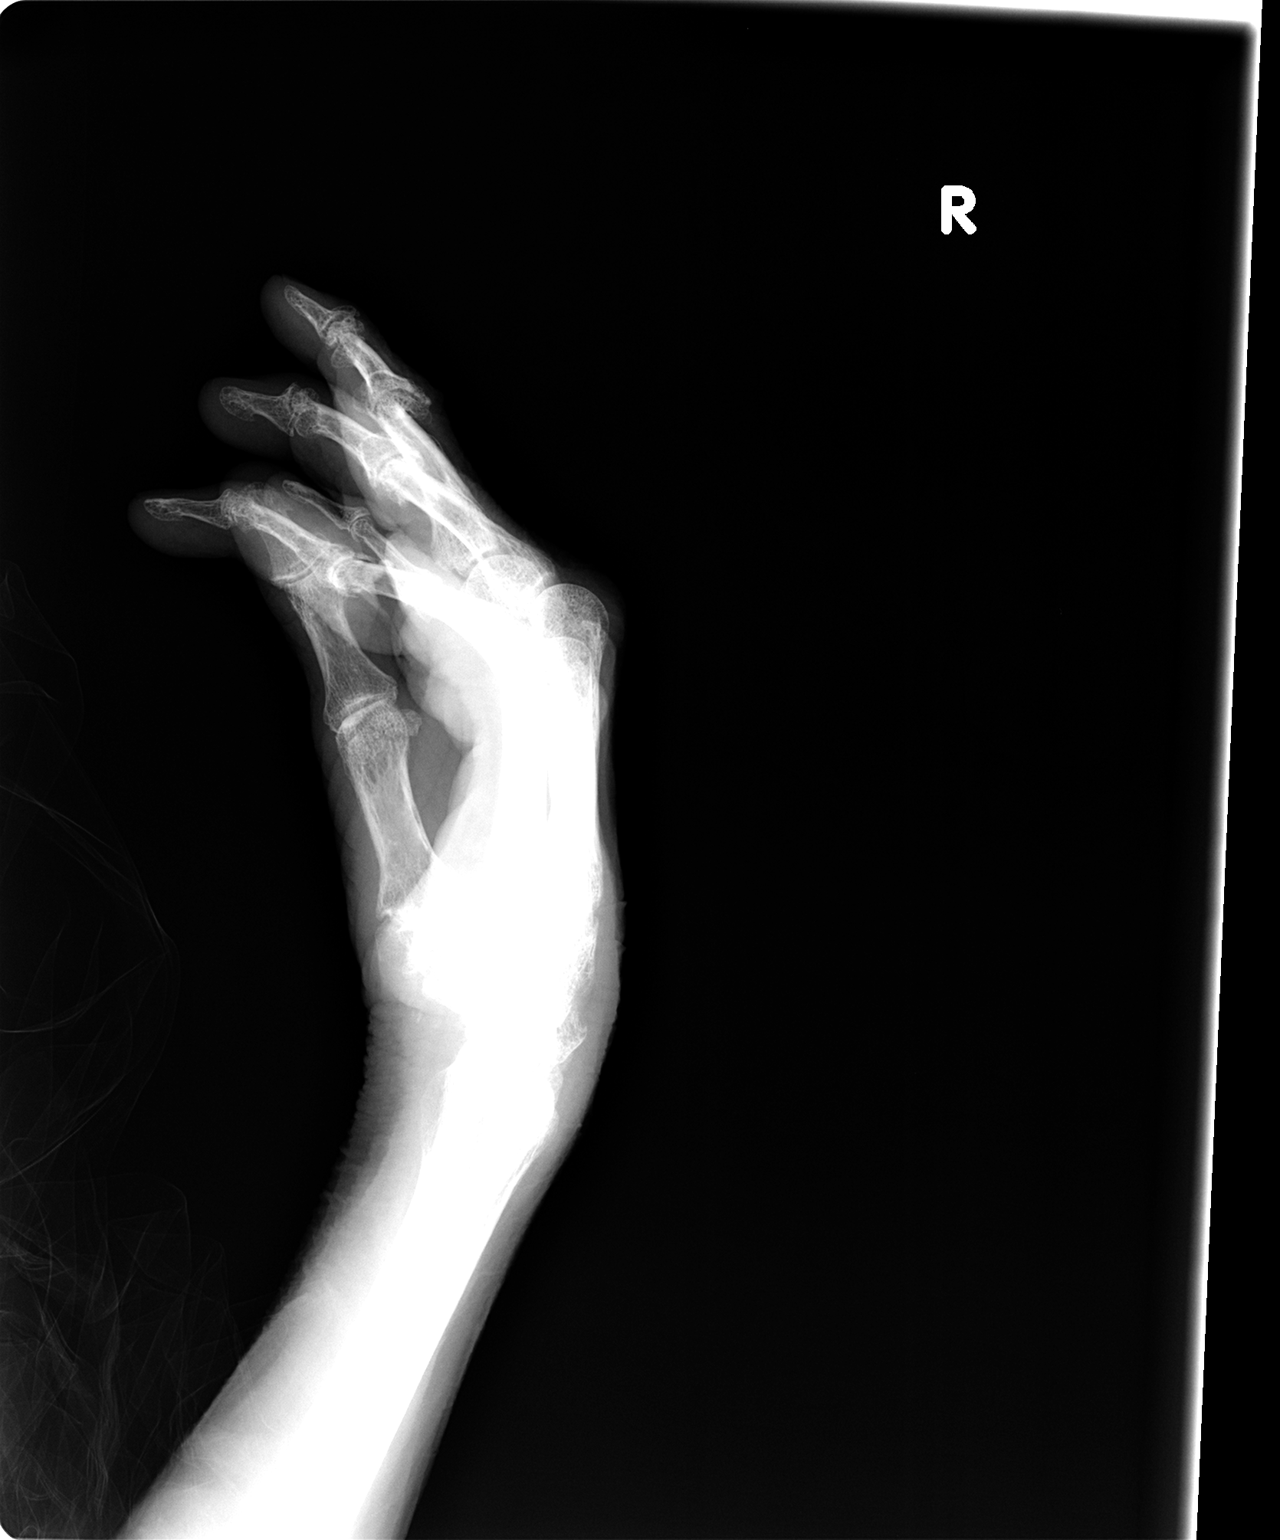

[2 of 2 positions shown; findings below may reference images not displayed]

FINDINGS: Fingers superimposed on lateral view, due to patient condition,
limiting assessment.

Severe osseous demineralization.
Diffuse degenerative changes, joint space narrowing, and spur
formation at multiple interphalangeal joints.
Associated subluxations at the PIP joints of the middle and index
fingers.
Scattered intercarpal degenerative changes as well.
No significant narrowing noted at the MCP joints.
Minimal chondrocalcinosis at wrist.
Calcified soft tissue nodule adjacent to the head of the proximal
phalanx ring finger.
No acute fracture, dislocation or bone destruction.
IMPRESSION: Severe osseous demineralization.
Significant degenerative changes at interphalangeal joints and to a
lesser degree intercarpal and MCP joints.
No definite acute bony findings.
Chondrocalcinosis at wrist, question CPPD/pseudogout.

## 2012-12-13 ENCOUNTER — Ambulatory Visit (HOSPITAL_COMMUNITY)
Admission: RE | Admit: 2012-12-13 | Discharge: 2012-12-13 | Disposition: A | Discharge status: Home / Self Care | Payer: Medicare Other | Source: Ambulatory Visit | Attending: Urology | Admitting: Urology

## 2012-12-13 DIAGNOSIS — N281 Cyst of kidney, acquired: Secondary | ICD-10-CM | POA: Insufficient documentation

## 2012-12-13 DIAGNOSIS — R310 Gross hematuria: Secondary | ICD-10-CM | POA: Insufficient documentation

## 2012-12-13 DIAGNOSIS — N390 Urinary tract infection, site not specified: Secondary | ICD-10-CM | POA: Insufficient documentation

## 2012-12-13 DIAGNOSIS — N269 Renal sclerosis, unspecified: Secondary | ICD-10-CM | POA: Insufficient documentation

## 2012-12-20 ENCOUNTER — Encounter (HOSPITAL_COMMUNITY)
Admission: RE | Disposition: A | Discharge status: Nursing Home (Includes ICF) | Payer: Self-pay | Source: Ambulatory Visit | Attending: Urology

## 2012-12-20 ENCOUNTER — Ambulatory Visit (HOSPITAL_COMMUNITY)
Admission: RE | Admit: 2012-12-20 | Discharge: 2012-12-20 | Disposition: A | Discharge status: Other Acute Inpatient Hospital | Payer: Medicare Other | Source: Ambulatory Visit | Attending: Urology | Admitting: Urology

## 2012-12-20 ENCOUNTER — Encounter (HOSPITAL_COMMUNITY): Payer: Self-pay | Admitting: *Deleted

## 2012-12-20 DIAGNOSIS — R319 Hematuria, unspecified: Secondary | ICD-10-CM | POA: Insufficient documentation

## 2012-12-20 DIAGNOSIS — N35919 Unspecified urethral stricture, male, unspecified site: Secondary | ICD-10-CM | POA: Insufficient documentation

## 2012-12-20 DIAGNOSIS — N342 Other urethritis: Secondary | ICD-10-CM | POA: Insufficient documentation

## 2012-12-20 HISTORY — PX: CYSTOSCOPY: SHX5120

## 2012-12-20 SURGERY — CYSTOSCOPY, FLEXIBLE
Anesthesia: LOCAL | Site: Urethra | Wound class: Clean Contaminated

## 2012-12-20 MED ORDER — LIDOCAINE VISCOUS 2 % MT SOLN
OROMUCOSAL | Status: AC
  Filled 2012-12-20: qty 15

## 2012-12-20 MED ORDER — LIDOCAINE VISCOUS 2 % MT SOLN
OROMUCOSAL | Status: DC | PRN
  Administered 2012-12-20: 1

## 2012-12-20 MED ORDER — STERILE WATER FOR IRRIGATION IR SOLN
Status: DC | PRN
  Administered 2012-12-20: 1000 mL

## 2012-12-20 MED ORDER — SODIUM CHLORIDE 0.9 % IR SOLN
Status: DC | PRN
  Administered 2012-12-20: 3000 mL via INTRAVESICAL

## 2012-12-20 SURGICAL SUPPLY — 19 items
BAG URINE DRAINAGE (UROLOGICAL SUPPLIES) ×3 IMPLANT
CLOTH BEACON ORANGE TIMEOUT ST (SAFETY) ×3 IMPLANT
DRAPE LAPAROTOMY TRNSV 102X78 (DRAPE) ×3 IMPLANT
DRAPE PROXIMA HALF (DRAPES) ×3 IMPLANT
GLOVE BIO SURGEON STRL SZ7 (GLOVE) ×3 IMPLANT
GLOVE BIOGEL PI IND STRL 7.0 (GLOVE) ×4 IMPLANT
GLOVE BIOGEL PI INDICATOR 7.0 (GLOVE) ×4
GLOVE EXAM NITRILE MD LF STRL (GLOVE) ×2 IMPLANT
GLOVE OPTIFIT SS 6.5 STRL BRWN (GLOVE) ×1 IMPLANT
GOWN STRL REIN XL XLG (GOWN DISPOSABLE) ×6 IMPLANT
IV NS IRRIG 3000ML ARTHROMATIC (IV SOLUTION) ×3 IMPLANT
MARKER SKIN DUAL TIP RULER LAB (MISCELLANEOUS) ×3 IMPLANT
SET IRRIG Y TYPE TUR BLADDER L (SET/KITS/TRAYS/PACK) ×3 IMPLANT
SPONGE GAUZE 4X4 12PLY (GAUZE/BANDAGES/DRESSINGS) ×3 IMPLANT
TOWEL OR 17X26 4PK STRL BLUE (TOWEL DISPOSABLE) ×4 IMPLANT
TRAY FOLEY CATH 16FR SILVER (SET/KITS/TRAYS/PACK) ×2 IMPLANT
VALVE DISPOSABLE (MISCELLANEOUS) ×3 IMPLANT
WATER STERILE IRR 1000ML POUR (IV SOLUTION) ×3 IMPLANT
YANKAUER SUCT BULB TIP 10FT TU (MISCELLANEOUS) ×3 IMPLANT

## 2012-12-20 NOTE — Op Note (Signed)
Op [id number]

## 2012-12-21 NOTE — Op Note (Signed)
NAMEAYLA, DUNIGAN NO.:  [id number]  MEDICAL RECORD NO.:  [id number]  LOCATION:  [facility]                          FACILITY:  [facility]  PHYSICIAN:  Ky Barban, M.D.DATE OF BIRTH:  [birth year]  DATE OF PROCEDURE: DATE OF DISCHARGE:  12/20/2012                              OPERATIVE REPORT   PREOPERATIVE DIAGNOSIS:  Hematuria.  POSTOPERATIVE DIAGNOSIS:  Distal urethritis with urethral stenosis.  PROCEDURE:  Cysto dilation and insertion of Foley catheter.  ANESTHESIA:  Local.  The patient in supine position, local anesthesia is injected into the urethra.  After waiting adequate time, tried to introduce flexible cystoscope, it was difficult because she has contracture of her legs. With some difficulty, I was able to find urethra and I could get the scope through it.  I dilated the urethra with Sissy Hoff curved sounds to 28-French and I was able to introduce the cystoscope.  The bladder was inspected.  The urine in the bladder is clear.  There is no blood in the bladder.  No tumor or stone or foreign body.  There is inflammation of the distal urethra.  I think that is where it was stenosed and I looked into the vagina, I did not see any blood in the vagina, so I removed the cystoscope, inserted #18 Foley catheter with the help of a catheter guide.  The urine coming out from the bladder is clear.  We will leave the catheter for a few days and take it out and see if she stops bleeding.  All the instruments were removed.  The patient left the operating room in a satisfactory condition.     Ky Barban, M.D.     MIJ/MEDQ  D:  12/20/2012  T:  12/21/2012  Job:  [id number]

## 2012-12-24 ENCOUNTER — Encounter (HOSPITAL_COMMUNITY): Payer: Self-pay | Admitting: Urology

## 2013-03-25 ENCOUNTER — Inpatient Hospital Stay (HOSPITAL_COMMUNITY)
Admission: EM | Admit: 2013-03-25 | Discharge: 2013-04-07 | DRG: 871 | Disposition: E | Payer: Medicare Other | Attending: Pulmonary Disease | Admitting: Pulmonary Disease

## 2013-03-25 ENCOUNTER — Emergency Department (HOSPITAL_COMMUNITY): Payer: Medicare Other

## 2013-03-25 ENCOUNTER — Encounter (HOSPITAL_COMMUNITY): Payer: Self-pay | Admitting: Emergency Medicine

## 2013-03-25 DIAGNOSIS — Z66 Do not resuscitate: Secondary | ICD-10-CM | POA: Diagnosis present

## 2013-03-25 DIAGNOSIS — F0390 Unspecified dementia without behavioral disturbance: Secondary | ICD-10-CM

## 2013-03-25 DIAGNOSIS — Z7401 Bed confinement status: Secondary | ICD-10-CM

## 2013-03-25 DIAGNOSIS — F418 Other specified anxiety disorders: Secondary | ICD-10-CM | POA: Diagnosis present

## 2013-03-25 DIAGNOSIS — R6520 Severe sepsis without septic shock: Secondary | ICD-10-CM

## 2013-03-25 DIAGNOSIS — E875 Hyperkalemia: Secondary | ICD-10-CM | POA: Diagnosis not present

## 2013-03-25 DIAGNOSIS — J9600 Acute respiratory failure, unspecified whether with hypoxia or hypercapnia: Secondary | ICD-10-CM | POA: Diagnosis present

## 2013-03-25 DIAGNOSIS — N179 Acute kidney failure, unspecified: Secondary | ICD-10-CM | POA: Diagnosis present

## 2013-03-25 DIAGNOSIS — J189 Pneumonia, unspecified organism: Secondary | ICD-10-CM

## 2013-03-25 DIAGNOSIS — N182 Chronic kidney disease, stage 2 (mild): Secondary | ICD-10-CM

## 2013-03-25 DIAGNOSIS — J159 Unspecified bacterial pneumonia: Secondary | ICD-10-CM | POA: Diagnosis present

## 2013-03-25 DIAGNOSIS — Z96649 Presence of unspecified artificial hip joint: Secondary | ICD-10-CM

## 2013-03-25 DIAGNOSIS — R3129 Other microscopic hematuria: Secondary | ICD-10-CM | POA: Diagnosis present

## 2013-03-25 DIAGNOSIS — I129 Hypertensive chronic kidney disease with stage 1 through stage 4 chronic kidney disease, or unspecified chronic kidney disease: Secondary | ICD-10-CM | POA: Diagnosis present

## 2013-03-25 DIAGNOSIS — J09X2 Influenza due to identified novel influenza A virus with other respiratory manifestations: Secondary | ICD-10-CM | POA: Diagnosis present

## 2013-03-25 DIAGNOSIS — E870 Hyperosmolality and hypernatremia: Secondary | ICD-10-CM | POA: Diagnosis not present

## 2013-03-25 DIAGNOSIS — N289 Disorder of kidney and ureter, unspecified: Secondary | ICD-10-CM

## 2013-03-25 DIAGNOSIS — F341 Dysthymic disorder: Secondary | ICD-10-CM | POA: Diagnosis present

## 2013-03-25 DIAGNOSIS — Z86718 Personal history of other venous thrombosis and embolism: Secondary | ICD-10-CM

## 2013-03-25 DIAGNOSIS — D649 Anemia, unspecified: Secondary | ICD-10-CM

## 2013-03-25 DIAGNOSIS — Z8673 Personal history of transient ischemic attack (TIA), and cerebral infarction without residual deficits: Secondary | ICD-10-CM

## 2013-03-25 DIAGNOSIS — N189 Chronic kidney disease, unspecified: Secondary | ICD-10-CM | POA: Diagnosis present

## 2013-03-25 DIAGNOSIS — A419 Sepsis, unspecified organism: Principal | ICD-10-CM

## 2013-03-25 DIAGNOSIS — J09X1 Influenza due to identified novel influenza A virus with pneumonia: Secondary | ICD-10-CM | POA: Diagnosis present

## 2013-03-25 LAB — URINALYSIS, ROUTINE W REFLEX MICROSCOPIC
Bilirubin Urine: NEGATIVE
GLUCOSE, UA: NEGATIVE mg/dL
LEUKOCYTES UA: NEGATIVE
Nitrite: NEGATIVE
PROTEIN: 30 mg/dL — AB
Specific Gravity, Urine: 1.025 (ref 1.005–1.030)
UROBILINOGEN UA: 0.2 mg/dL (ref 0.0–1.0)
pH: 5.5 (ref 5.0–8.0)

## 2013-03-25 LAB — CG4 I-STAT (LACTIC ACID): LACTIC ACID, VENOUS: 2.83 mmol/L — AB (ref 0.5–2.2)

## 2013-03-25 LAB — BLOOD GAS, ARTERIAL
Acid-base deficit: 4.7 mmol/L — ABNORMAL HIGH (ref 0.0–2.0)
BICARBONATE: 19.2 meq/L — AB (ref 20.0–24.0)
DRAWN BY: 23534
FIO2: 0.21 %
O2 Content: 21 L/min
O2 SAT: 93.9 %
PO2 ART: 68.9 mmHg — AB (ref 80.0–100.0)
Patient temperature: 37
TCO2: 18.5 mmol/L (ref 0–100)
pCO2 arterial: 31.3 mmHg — ABNORMAL LOW (ref 35.0–45.0)
pH, Arterial: 7.403 (ref 7.350–7.450)

## 2013-03-25 LAB — CBC WITH DIFFERENTIAL/PLATELET
Basophils Absolute: 0 10*3/uL (ref 0.0–0.1)
Basophils Relative: 0 % (ref 0–1)
Eosinophils Absolute: 0 10*3/uL (ref 0.0–0.7)
Eosinophils Relative: 0 % (ref 0–5)
HEMATOCRIT: 25.5 % — AB (ref 36.0–46.0)
HEMOGLOBIN: 7.9 g/dL — AB (ref 12.0–15.0)
Lymphocytes Relative: 13 % (ref 12–46)
Lymphs Abs: 0.5 10*3/uL — ABNORMAL LOW (ref 0.7–4.0)
MCH: 28.2 pg (ref 26.0–34.0)
MCHC: 31 g/dL (ref 30.0–36.0)
MCV: 91.1 fL (ref 78.0–100.0)
MONO ABS: 0.2 10*3/uL (ref 0.1–1.0)
MONOS PCT: 6 % (ref 3–12)
NEUTROS ABS: 3.3 10*3/uL (ref 1.7–7.7)
NEUTROS PCT: 81 % — AB (ref 43–77)
Platelets: 142 10*3/uL — ABNORMAL LOW (ref 150–400)
RBC: 2.8 MIL/uL — AB (ref 3.87–5.11)
RDW: 16.5 % — ABNORMAL HIGH (ref 11.5–15.5)
WBC: 4.1 10*3/uL (ref 4.0–10.5)

## 2013-03-25 LAB — COMPREHENSIVE METABOLIC PANEL
ALBUMIN: 3.6 g/dL (ref 3.5–5.2)
ALT: 14 U/L (ref 0–35)
AST: 41 U/L — ABNORMAL HIGH (ref 0–37)
Alkaline Phosphatase: 92 U/L (ref 39–117)
BILIRUBIN TOTAL: 0.3 mg/dL (ref 0.3–1.2)
BUN: 28 mg/dL — AB (ref 6–23)
CHLORIDE: 107 meq/L (ref 96–112)
CO2: 21 mEq/L (ref 19–32)
Calcium: 8.2 mg/dL — ABNORMAL LOW (ref 8.4–10.5)
Creatinine, Ser: 1.67 mg/dL — ABNORMAL HIGH (ref 0.50–1.10)
GFR, EST AFRICAN AMERICAN: 32 mL/min — AB (ref >90)
GFR, EST NON AFRICAN AMERICAN: 28 mL/min — AB (ref >90)
GLUCOSE: 104 mg/dL — AB (ref 70–99)
Potassium: 4.6 mEq/L (ref 3.7–5.3)
Sodium: 145 mEq/L (ref 137–147)
Total Protein: 7 g/dL (ref 6.0–8.3)

## 2013-03-25 LAB — INFLUENZA PANEL BY PCR (TYPE A & B)
H1N1FLUPCR: NOT DETECTED
INFLAPCR: POSITIVE — AB
Influenza B By PCR: NEGATIVE

## 2013-03-25 LAB — RETICULOCYTES
RBC.: 2.25 MIL/uL — AB (ref 3.87–5.11)
RETIC CT PCT: 1.9 % (ref 0.4–3.1)
Retic Count, Absolute: 42.8 10*3/uL (ref 19.0–186.0)

## 2013-03-25 LAB — URINE MICROSCOPIC-ADD ON

## 2013-03-25 MED ORDER — SERTRALINE HCL 50 MG PO TABS: 50.0000 mg | ORAL_TABLET | ORAL | Status: DC

## 2013-03-25 MED ORDER — SENNA 8.6 MG PO TABS: 1.0000 | ORAL_TABLET | Freq: Every day | ORAL | Status: DC

## 2013-03-25 MED ORDER — SODIUM CHLORIDE 0.9 % IV SOLN
1000.0000 mL | INTRAVENOUS | Status: DC
  Administered 2013-03-25: 1000 mL via INTRAVENOUS

## 2013-03-25 MED ORDER — DEXTROSE 5 % IV SOLN
1.0000 g | INTRAVENOUS | Status: DC
  Administered 2013-03-26 – 2013-03-28 (×3): 1 g via INTRAVENOUS
  Filled 2013-03-25 (×7): qty 10

## 2013-03-25 MED ORDER — ACETAMINOPHEN 325 MG PO TABS: 650.0000 mg | ORAL_TABLET | Freq: Four times a day (QID) | ORAL | Status: DC | PRN

## 2013-03-25 MED ORDER — QUETIAPINE FUMARATE 25 MG PO TABS: 25.0000 mg | ORAL_TABLET | Freq: Every day | ORAL | Status: DC

## 2013-03-25 MED ORDER — DEXTROSE 5 % IV SOLN
INTRAVENOUS | Status: AC
  Filled 2013-03-25: qty 10

## 2013-03-25 MED ORDER — ONDANSETRON HCL 4 MG PO TABS: 4.0000 mg | ORAL_TABLET | Freq: Four times a day (QID) | ORAL | Status: DC | PRN

## 2013-03-25 MED ORDER — ACETAMINOPHEN 650 MG RE SUPP
650.0000 mg | Freq: Once | RECTAL | Status: AC
  Administered 2013-03-25: 650 mg via RECTAL
  Filled 2013-03-25: qty 1

## 2013-03-25 MED ORDER — SODIUM CHLORIDE 0.9 % IV SOLN
1000.0000 mL | Freq: Once | INTRAVENOUS | Status: AC
  Administered 2013-03-25: 1000 mL via INTRAVENOUS

## 2013-03-25 MED ORDER — ALPRAZOLAM 0.5 MG PO TABS: 0.5000 mg | ORAL_TABLET | Freq: Every day | ORAL | Status: DC

## 2013-03-25 MED ORDER — ENOXAPARIN SODIUM 40 MG/0.4ML ~~LOC~~ SOLN
40.0000 mg | SUBCUTANEOUS | Status: DC
  Administered 2013-03-25: 40 mg via SUBCUTANEOUS
  Filled 2013-03-25: qty 0.4

## 2013-03-25 MED ORDER — DEXTROSE 5 % IV SOLN
500.0000 mg | Freq: Once | INTRAVENOUS | Status: AC
  Administered 2013-03-25: 500 mg via INTRAVENOUS

## 2013-03-25 MED ORDER — ASPIRIN EC 81 MG PO TBEC: 81.0000 mg | DELAYED_RELEASE_TABLET | Freq: Every day | ORAL | Status: DC

## 2013-03-25 MED ORDER — SODIUM CHLORIDE 0.9 % IV SOLN
INTRAVENOUS | Status: AC
  Administered 2013-03-25: 18:00:00 via INTRAVENOUS

## 2013-03-25 MED ORDER — ONDANSETRON HCL 4 MG/2ML IJ SOLN: 4.0000 mg | Freq: Four times a day (QID) | INTRAMUSCULAR | Status: DC | PRN

## 2013-03-25 MED ORDER — ACETAMINOPHEN 650 MG RE SUPP: 650.0000 mg | Freq: Four times a day (QID) | RECTAL | Status: DC | PRN

## 2013-03-25 MED ORDER — DEXTROSE 5 % IV SOLN
1.0000 g | INTRAVENOUS | Status: DC
  Administered 2013-03-25: 1 g via INTRAVENOUS
  Filled 2013-03-25 (×2): qty 10

## 2013-03-25 MED ORDER — DEXTROSE 5 % IV SOLN
500.0000 mg | INTRAVENOUS | Status: DC
  Administered 2013-03-26 – 2013-03-28 (×3): 500 mg via INTRAVENOUS
  Filled 2013-03-25 (×7): qty 500

## 2013-03-25 MED ORDER — SODIUM CHLORIDE 0.9 % IJ SOLN
3.0000 mL | Freq: Two times a day (BID) | INTRAMUSCULAR | Status: DC
  Administered 2013-03-25 – 2013-03-26 (×2): 3 mL via INTRAVENOUS

## 2013-03-25 MED ORDER — STARCH (THICKENING) PO POWD
ORAL | Status: DC | PRN
  Filled 2013-03-25: qty 227

## 2013-03-25 MED ORDER — LORATADINE 10 MG PO TABS: 10.0000 mg | ORAL_TABLET | Freq: Every day | ORAL | Status: DC

## 2013-03-25 NOTE — ED Notes (Signed)
resp therapy at bedside for blood gas, pt repositioned,

## 2013-03-25 NOTE — ED Notes (Addendum)
Pt respiratory pattern has changed to more of a "deep snore", pulse ox 94-95% on RA, Dr Effie ShyWentz at bedside speaking with family, pt will open her eyes with stimulation,

## 2013-03-25 NOTE — H&P (Signed)
Patient seen and examined. Above note reviewed.  She's been admitted with fevers, cough. Chest x-ray shows underlying pneumonia. She does appear to have some dehydration with elevation in her BUN and creatinine. She's been started on antibiotic coverage for community-acquired pneumonia. She is also receiving IV fluids for dehydration. She has had a significant decline in hemoglobin without any evidence of bleeding. This may be related to anemia of chronic disease. We'll check an anemia panel,  stool for fecal occult blood, type and cross. With IV hydration, she may need a transfusion tomorrow. With her high fevers and normal WBC count. We will check influenza PCR.we'll obtain swallow evaluation to rule out aspiration and for recommendations regarding modifications to diet   Patient will be admitted to the service of Dr. Juanetta GoslingHawkins. [facility] hospitalists will be available for any issues until 7 AM on 2/17.  MEMON,JEHANZEB

## 2013-03-25 NOTE — ED Notes (Signed)
Received report on pt , pt is a resident of [facility] who was sent to er this am for further evaluation of cough, fever, son at bedside states that pt is not mobile, has been having a cough since over the weekend, does not communicate much at all and if she does usually he is not able to understand what she is trying to say.

## 2013-03-25 NOTE — ED Notes (Signed)
Karen NP at bedside,  

## 2013-03-25 NOTE — ED Notes (Signed)
Dr Wentz at bedside,  

## 2013-03-25 NOTE — Evaluation (Signed)
Clinical/Bedside Swallow Evaluation  Patient Details  Name: [name] MRN: [id number] Date of Birth: 04-Sep-1930  Today's Date: 12-28-13 Time: 1620-1650 SLP Time Calculation (min): 30 min  Past Medical History:  Past Medical History  Diagnosis Date  . History of DVT (deep vein thrombosis)     Old LE DVT per doppler 4/12  . Depression   . Dysphagia     Schatzki ring dilatation 07/2006  . Diverticulosis   . Hypertension   . Dementia   . Tachycardia   . Stroke   . Rhabdomyolysis     05/2010 after being found down at home.  . ARF (acute renal failure)     05/2010. Resolved.  Marland Kitchen. PNA (pneumonia)   . CKD (chronic kidney disease), stage II   . Hemorrhoids   . UTI (lower urinary tract infection)   . Urinary retention     Hx of in 05/2010.  Marland Kitchen. Near syncope   . Constipation   . Depression with anxiety 09/30/2010  . Cellulitis   . Hematuria   . Anxiety    Past Surgical History:  Past Surgical History  Procedure Laterality Date  . Hemorroidectomy    . Ovarian cyst removal    . Total hip arthroplasty    . Lumbar back surgery      2004.  Marland Kitchen. Cystoscopy N/A 12/20/2012    Procedure: CYSTOSCOPY FLEXIBLE;  Surgeon: Ky BarbanMohammad I Javaid, MD;  Location: [facility];  Service: Urology;  Laterality: N/A;   HPI:  [name] is a 80 y.o. female resident at [facility] with a past medical history of advanced dementia, hypertension, dysphasia, stroke, chronic kidney disease stage II, urinary retention, depression with anxiety, presents to the emergency department with the chief complaint of cough and altered mental status. Her caregivers noticed that she was less alert today than usual. In addition she has had a cough for the past 3 days which will seemed worse this morning. Family is at bedside (son and daughter in law) and report that pt had a fever. It is reported that she is typically bedbound but will get out of bed on occasion for meals. Chest x-ray shows left lower lobe  pneumonia.   Assessment / Plan / Recommendation Clinical Impression  [name] demonstrates poor po readiness skills (decreased alertness, inability to follow commands, lack of spontaneous swallows), however she does close her lips around an ice chip and move it around in oral cavity with verbal cueing. One delayed swallow was elicited (seemingly poor hyolaryngeal excursion). Pt needs cues to close lips for swallow. Trial 1/4 tsp sherbet  was removed via suction due to pt with poor manipulation of bolus. Recommend NPO until more alert. Family present for evaluation and voice understanding of recommendations. Focus on oral care, pt with open mouth breathing posture.    Aspiration Risk  Moderate    Diet Recommendation NPO        Other  Recommendations Oral Care Recommendations: Oral care Q4 per protocol   Follow Up Recommendations  24 hour supervision/assistance    Frequency and Duration min 2x/week  1 week       SLP Swallow Goals  Reasses for po readiness   Swallow Study Prior Functional Status   [facility]ident [facility], total care    General Date of Onset: November 17, 2013 HPI: [name] is a 80 y.o. female resident at [facility] with a past medical history of advanced dementia, hypertension, dysphasia, stroke, chronic  kidney disease stage II, urinary retention, depression with anxiety, presents to the emergency department with the chief complaint of cough and altered mental status. Her caregivers noticed that she was less alert today than usual. In addition she has had a cough for the past 3 days which will seemed worse this morning. Family is at bedside (son and daughter in law) and report that pt had a fever. It is reported that she is typically bedbound but will get out of bed on occasion for meals. Chest x-ray shows left lower lobe pneumonia. Type of Study: Bedside swallow evaluation Previous Swallow Assessment: None on record, but pt reportedly on thickend  liquids at [facility] Diet Prior to this Study: NPO Temperature Spikes Noted: Yes Respiratory Status: Nasal cannula History of Recent Intubation: No Behavior/Cognition: Lethargic Oral Cavity - Dentition: Adequate natural dentition Self-Feeding Abilities: Needs assist Patient Positioning: Upright in bed Baseline Vocal Quality: Aphonic Volitional Cough: Cognitively unable to elicit Volitional Swallow: Unable to elicit    Oral/Motor/Sensory Function Overall Oral Motor/Sensory Function: Impaired Labial ROM: Within Functional Limits Labial Symmetry: Within Functional Limits Labial Strength: Reduced Labial Sensation: Within Functional Limits Lingual ROM: Within Functional Limits Lingual Symmetry: Within Functional Limits Lingual Strength: Reduced Lingual Sensation: Within Functional Limits Facial ROM: Within Functional Limits Facial Symmetry: Within Functional Limits Facial Strength: Reduced Mandible: Within Functional Limits   Ice Chips Ice chips: Impaired Presentation: Spoon Oral Phase Impairments: Reduced labial seal;Reduced lingual movement/coordination;Impaired anterior to posterior transit;Poor awareness of bolus Oral Phase Functional Implications: Prolonged oral transit;Oral holding Pharyngeal Phase Impairments: Suspected delayed Swallow;Decreased hyoid-laryngeal movement;Cough - Delayed   Thin Liquid Thin Liquid: Not tested    Nectar Thick Nectar Thick Liquid: Impaired Presentation: Spoon Oral Phase Impairments: Reduced labial seal;Reduced lingual movement/coordination;Impaired anterior to posterior transit;Poor awareness of bolus Oral phase functional implications: Oral holding (suctioned for removal)   Honey Thick Honey Thick Liquid: Not tested   Puree Puree: Not tested   Solid      Solid: Not tested      Thank you,  Havery Moros, CCC-SLP [phone]  Makaia Rappa Apr 08, 2013,4:59 PM

## 2013-03-25 NOTE — ED Notes (Signed)
Report given to Plymouth MeetingLeeann, Rn on 300

## 2013-03-25 NOTE — ED Notes (Signed)
Caregiver at bedside, updated on plan of care,

## 2013-03-25 NOTE — H&P (Signed)
[facility] History and Physical  [name]:[id number] DOB: Sep 11, 1930 DOA: 04/04/2013  Referring physician: Effie Shy PCP: Fredirick Maudlin, MD   Chief Complaint: cough  HPI: [name] is a 80 y.o. female with a past medical history of advanced dementia, hypertension, dysphasia, stroke, chronic kidney disease stage II, urinary retention, depression with anxiety, presents to the emergency department with the chief complaint of cough and altered mental status. Information is obtained from the chart and ED staff as there is no family or caregivers present. Accordingly patient lives with her caregiver who noticed that she was less alert today than usual. In addition she has had a cough for the past 3 days which will seemed worse this morning. It is reported that she is typically bedbound but will get out of bed on occasion for meals. There is no report of any recent nausea vomiting diarrhea.  Initial workup in the emergency department yields a chest x-ray with left lower lobe pneumonia. Lab work includes an arterial blood gas with a pH of 7.4 PCO2 31.3 PO2 68.9 bicarbonate 19.2, lactic acid 2.83. Metabolic panel with a creatinine of 1.67 BUN of 28. CBC with a hemoglobin of 7.9 and platelets of 142. Vital signs in the emergency department yield temperature of 102.9 and a heart rate of 102 and respiration rate of 28. Systolic blood pressure range 811-914. In the emergency department she is given 2 L of normal saline, 500 mg of azithromycin intravenously and Rocephin 1 g intravenously. We are asked to admit  Review of Systems:  Unable to complete review of systems do to patient's dementia   Past Medical History  Diagnosis Date  . History of DVT (deep vein thrombosis)     Old LE DVT per doppler 4/12  . Depression   . Dysphagia     Schatzki ring dilatation 07/2006  . Diverticulosis   . Hypertension   . Dementia   . Tachycardia   . Stroke   . Rhabdomyolysis     05/2010 after  being found down at home.  . ARF (acute renal failure)     05/2010. Resolved.  Marland Kitchen PNA (pneumonia)   . CKD (chronic kidney disease), stage II   . Hemorrhoids   . UTI (lower urinary tract infection)   . Urinary retention     Hx of in 05/2010.  Marland Kitchen Near syncope   . Constipation   . Depression with anxiety 09/30/2010  . Cellulitis   . Hematuria   . Anxiety    Past Surgical History  Procedure Laterality Date  . Hemorroidectomy    . Ovarian cyst removal    . Total hip arthroplasty    . Lumbar back surgery      2004.  Marland Kitchen Cystoscopy N/A 12/20/2012    Procedure: CYSTOSCOPY FLEXIBLE;  Surgeon: Ky Barban, MD;  Location: [facility];  Service: Urology;  Laterality: N/A;   Social History:  reports that she has never smoked. She does not have any smokeless tobacco history on file. She reports that she does not drink alcohol or use illicit drugs. She lives with her caregiver. She is mostly bed bound Allergies  Allergen Reactions  . Hydrocodone Rash  . Strawberry Other (See Comments)    Unknown    History reviewed. No pertinent family history.   Prior to Admission medications   Medication Sig Start Date End Date Taking? Authorizing Provider  acetaminophen (TYLENOL) 500 MG tablet Take 500 mg by mouth 2 (two) times daily.  Yes Historical Provider, MD  ALPRAZolam Prudy Feeler(XANAX) 0.5 MG tablet Take 0.5 mg by mouth at bedtime.   Yes Historical Provider, MD  aspirin EC 81 MG tablet Take 81 mg by mouth daily.     Yes Historical Provider, MD  cephALEXin (KEFLEX) 250 MG capsule Take 250 mg by mouth daily. Continuous for cellulitis   Yes Historical Provider, MD  loratadine (CLARITIN) 10 MG tablet Take 10 mg by mouth daily. Patient takes at 2pm (Patient started on 02/15/11)   Yes Historical Provider, MD  meloxicam (MOBIC) 7.5 MG tablet Take 7.5 mg by mouth 2 (two) times daily.   Yes Historical Provider, MD  QUEtiapine (SEROQUEL) 25 MG tablet Take 25 mg by mouth at bedtime.    Yes Historical Provider, MD  senna  (SENNA LAXATIVE) 8.6 MG tablet Take 1 tablet by mouth daily.   Yes Historical Provider, MD  sertraline (ZOLOFT) 50 MG tablet Take 50 mg by mouth every morning.    Yes Historical Provider, MD   Physical Exam: Filed Vitals:   09/21/13 1302  BP: 101/81  Pulse: 96  Temp:   Resp: 26    BP 101/81  Pulse 96  Temp(Src) 102.3 F (39.1 C) (Rectal)  Resp 26  SpO2 95%  General:  Appears calm and comfortable Eyes: PERRL, normal lids, irises & conjunctiva ENT: Ears clear nose without drainage oropharynx without erythema or exudate. Mucous membranes of her mouth are pink slightly dry. Neck: no LAD, masses or thyromegaly full range of motion Cardiovascular: RRR, no m/r/g. No LE edema. Respiratory: Normal effort. Week cough effort. Breath sounds are diminished throughout but somewhat coarse. Fine crackles auscultated bilateral bases. No wheeze. Abdomen: soft, ntnd positive bowel sounds throughout. No guarding Skin: no rash or induration seen on limited exam Musculoskeletal: grossly normal tone BUE/BLE Psychiatric: grossly normal mood and affect, speech fluent and appropriate Neurologic: grossly non-focal. Nonverbal. Will not follow commands. Will make eye contact and follow you with her eyes. Facial symmetry           Labs on Admission:  Basic Metabolic Panel:  Recent Labs Lab 09/21/13 1009  NA 145  K 4.6  CL 107  CO2 21  GLUCOSE 104*  BUN 28*  CREATININE 1.67*  CALCIUM 8.2*   Liver Function Tests:  Recent Labs Lab 09/21/13 1009  AST 41*  ALT 14  ALKPHOS 92  BILITOT 0.3  PROT 7.0  ALBUMIN 3.6   No results found for this basename: LIPASE, AMYLASE,  in the last 168 hours No results found for this basename: AMMONIA,  in the last 168 hours CBC:  Recent Labs Lab 09/21/13 1009  WBC 4.1  NEUTROABS 3.3  HGB 7.9*  HCT 25.5*  MCV 91.1  PLT 142*   Cardiac Enzymes: No results found for this basename: CKTOTAL, CKMB, CKMBINDEX, TROPONINI,  in the last 168 hours  BNP  (last 3 results) No results found for this basename: PROBNP,  in the last 8760 hours CBG: No results found for this basename: GLUCAP,  in the last 168 hours  Radiological Exams on Admission: Dg Chest Port 1 View  2013/04/11   CLINICAL DATA:  Cough and fever  EXAM: PORTABLE CHEST - 1 VIEW  COMPARISON:  10/07/2012  FINDINGS: There is left lower lobe infiltrate consistent with pneumonia. There is probably a small amount of left pleural fluid. There is chronic pleural and parenchymal scarring at the lung apices right more than left. Left apex not well seen because of the overlying head shadow.  No evidence of pulmonary edema.  IMPRESSION: Left lower lobe pneumonia.   Electronically Signed   By: Paulina Fusi M.D.   On: 2013-04-20 09:52    EKG: I  Assessment/Plan Principal Problem:   Sepsis; likely related to left lower lobe pneumonia. Will admit to telemetry. Will provide supportive therapy i.e. IV fluids, , Tylenol for fever, sputum culture is available. Await blood cultures. Will continue Rocephin and azithromycin. At my exam vital signs are stable and she is nontoxic appearing. Active Problems:  Community acquired bacterial pneumonia: Await blood cultures. Will obtain strep pneumo antigen Legionella antigen. Will get sputum culture if available. Will provide oxygen therapy and antibiotics as above. Will continue Tylenol for fever.    CKD (chronic kidney disease), stage II creatinine seems to be a little above baseline. Will hold any nephrotoxins. Will gently hydrate with IV fluids. Will recheck in the a.m. on its her urine Output   Anemia: History of same however hemoglobin appears to have dropped below baseline. Will check FOBT. No obvious signs of bleeding. Will monitor closely    Hematuria, microscopic: History of same. Will monitor    Dementia: Appears stable at baseline.    Depression with anxiety: Appears stable at baseline.      Code Status: DO NOT RESUSCITATE Family  Communication: son and caregiver Disposition Plan: home when ready (indicate anticipated LOS)  Time spent: 65 minutes  [facility] Pager [phone]

## 2013-03-25 NOTE — Progress Notes (Signed)
ANTIBIOTIC CONSULT NOTE - INITIAL  Pharmacy Consult for Rocephin (with Zithromax) Indication: pneumonia  Allergies  Allergen Reactions  . Hydrocodone Rash  . Strawberry Other (See Comments)    Unknown    Patient Measurements:   Last recorded weight = 49.9Kg (09/01/11)  Vital Signs: Temp: 102.9 F (39.4 C) (02/16 0911) Temp src: Rectal (02/16 0911) BP: 110/43 mmHg (02/16 1058) Pulse Rate: 102 (02/16 1058) Intake/Output from previous day:   Intake/Output from this shift:    Labs:  Recent Labs  03/22/2013 1009  WBC 4.1  HGB 7.9*  PLT 142*  CREATININE 1.67*   The CrCl is unknown because both a height and weight (above a minimum accepted value) are required for this calculation. No results found for this basename: VANCOTROUGH, Leodis BinetVANCOPEAK, VANCORANDOM, GENTTROUGH, GENTPEAK, GENTRANDOM, TOBRATROUGH, TOBRAPEAK, TOBRARND, AMIKACINPEAK, AMIKACINTROU, AMIKACIN,  in the last 72 hours   Microbiology: Recent Results (from the past 720 hour(s))  CULTURE, BLOOD (ROUTINE X 2)     Status: None   Collection Time    03/23/2013 10:09 AM      Result Value Ref Range Status   Specimen Description Blood   Final   Special Requests NONE   Final   Culture NO GROWTH <24 HRS   Final   Report Status PENDING   Incomplete  CULTURE, BLOOD (ROUTINE X 2)     Status: None   Collection Time    04/05/2013 10:09 AM      Result Value Ref Range Status   Specimen Description Blood   Final   Special Requests NONE   Final   Culture NO GROWTH <24 HRS   Final   Report Status PENDING   Incomplete   Medical History: Past Medical History  Diagnosis Date  . History of DVT (deep vein thrombosis)     Old LE DVT per doppler 4/12  . Depression   . Dysphagia     Schatzki ring dilatation 07/2006  . Diverticulosis   . Hypertension   . Dementia   . Tachycardia   . Stroke   . Rhabdomyolysis     05/2010 after being found down at home.  . ARF (acute renal failure)     05/2010. Resolved.  Marland Kitchen. PNA (pneumonia)   .  CKD (chronic kidney disease), stage II   . Hemorrhoids   . UTI (lower urinary tract infection)   . Urinary retention     Hx of in 05/2010.  Marland Kitchen. Near syncope   . Constipation   . Depression with anxiety 09/30/2010  . Cellulitis   . Hematuria   . Anxiety    Medications:  Scheduled:   Assessment: 80yo female admitted with cough, fever, and suspected CAP.  SCr elevated on admission.  Goal of Therapy:  Eradicate infection.  Plan:  Rocephin 1gm IV q24hrs (renal adjustment not needed) Monitor progress and cultures  Valrie HartHall, Ameshia Pewitt A 03/15/2013,11:19 AM

## 2013-03-25 NOTE — ED Provider Notes (Signed)
CSN: [id number]     Arrival date & time April 03, 2013  4098 History   This chart was scribed for [name], MD by Manuela Schwartz, ED scribe. This patient was seen in room APA06/APA06 and the patient's care was started at 8053.  Chief Complaint  Patient presents with  . Cough   The history is provided by the patient. No language interpreter was used.   HPI Comments: [name] is a 80 y.o. female brought in by ambulance, who presents to the Emergency Department from [facility] care complaining of constant, gradually worsened cough, onset 3 days ago which acutely worsened this AM. No recent illnesses leading up to her current problem. Her son is here with her along w/her caretaker. Pt is bedridden all day and gets out of bed w/assistance to eat meals. She arrives w/fever of 102.9 F and caretaker reports that she had not checked it today. She checked it yesterday and pt had no fever (she does not check temp day to day). Pt uses thickit normally to eat/drink. She has baseline drooling which has not changed and has not had any difficulties eating; no choking but she has not yet eating anything today. Caretaker reports that she is normally awake and alert.   Past Medical History  Diagnosis Date  . History of DVT (deep vein thrombosis)     Old LE DVT per doppler 4/12  . Depression   . Dysphagia     Schatzki ring dilatation 07/2006  . Diverticulosis   . Hypertension   . Dementia   . Tachycardia   . Stroke   . Rhabdomyolysis     05/2010 after being found down at home.  . ARF (acute renal failure)     05/2010. Resolved.  Marland Kitchen PNA (pneumonia)   . CKD (chronic kidney disease), stage II   . Hemorrhoids   . UTI (lower urinary tract infection)   . Urinary retention     Hx of in 05/2010.  Marland Kitchen Near syncope   . Constipation   . Depression with anxiety 09/30/2010  . Cellulitis   . Hematuria   . Anxiety    Past Surgical History  Procedure Laterality Date  . Hemorroidectomy    . Ovarian cyst  removal    . Total hip arthroplasty    . Lumbar back surgery      2004.  Marland Kitchen Cystoscopy N/A 12/20/2012    Procedure: CYSTOSCOPY FLEXIBLE;  Surgeon: Ky Barban, MD;  Location: [facility];  Service: Urology;  Laterality: N/A;   History reviewed. No pertinent family history. History  Substance Use Topics  . Smoking status: Never Smoker   . Smokeless tobacco: Not on file  . Alcohol Use: No   OB History   Grav Para Term Preterm Abortions TAB SAB Ect Mult Living   1 1 1       1      Review of Systems  Constitutional: Positive for fever. Negative for chills.  HENT: Negative for congestion and rhinorrhea.   Respiratory: Positive for cough. Negative for shortness of breath.   Cardiovascular: Negative for chest pain.  Gastrointestinal: Negative for nausea, vomiting, abdominal pain and diarrhea.  Musculoskeletal: Negative for back pain.  Skin: Negative for color change and rash.  Neurological: Negative for syncope.  All other systems reviewed and are negative.    Allergies  Hydrocodone and Strawberry  Home Medications   Current Outpatient Rx  Name  Route  Sig  Dispense  Refill  . acetaminophen (  TYLENOL) 500 MG tablet   Oral   Take 500 mg by mouth 2 (two) times daily.         Marland Kitchen. ALPRAZolam (XANAX) 0.5 MG tablet   Oral   Take 0.5 mg by mouth at bedtime.         Marland Kitchen. aspirin EC 81 MG tablet   Oral   Take 81 mg by mouth daily.           . cephALEXin (KEFLEX) 250 MG capsule   Oral   Take 250 mg by mouth daily. Continuous for cellulitis         . loratadine (CLARITIN) 10 MG tablet   Oral   Take 10 mg by mouth daily. Patient takes at 2pm (Patient started on 02/15/11)         . meloxicam (MOBIC) 7.5 MG tablet   Oral   Take 7.5 mg by mouth 2 (two) times daily.         . QUEtiapine (SEROQUEL) 25 MG tablet   Oral   Take 25 mg by mouth at bedtime.          . senna (SENNA LAXATIVE) 8.6 MG tablet   Oral   Take 1 tablet by mouth daily.         . sertraline  (ZOLOFT) 50 MG tablet   Oral   Take 50 mg by mouth every morning.           Triage Vitals: BP 118/36  Pulse 95  Temp(Src) 102.9 F (39.4 C) (Rectal)  Resp 22  SpO2 96%  Physical Exam  Nursing note and vitals reviewed. Constitutional: She is oriented to person, place, and time. She appears well-developed and well-nourished. No distress.  HENT:  Head: Normocephalic and atraumatic.  Eyes: Conjunctivae are normal. Right eye exhibits no discharge. Left eye exhibits no discharge.  Neck: Normal range of motion.  Cardiovascular: Normal rate.   Pulmonary/Chest: Effort normal. No respiratory distress. She has rales.  Rales left base  Musculoskeletal: Normal range of motion. She exhibits no edema.  Neurological: She is alert and oriented to person, place, and time.  Skin: Skin is warm and dry.  Psychiatric: She has a normal mood and affect. Thought content normal.   ED Course  Procedures (including critical care time) DIAGNOSTIC STUDIES: Oxygen Saturation is 96% on room air, adequate by my interpretation.    COORDINATION OF CARE: At 945 AM Discussed treatment plan with patient which includes antibiotics, tylenol, CXR, blood work, UA. Patient agrees.   3:24 PM Report from staff that within the past few minutes her mental status has somewhat changed and she is now less responsive and snoring in bed. Patient still with a fever, last checked 102.3 F. Extremity tone is symmetric. I do not suspect, acute CVA. She is DO NOT RESUSCITATE. Lactate is slightly elevated. Discussed results of CXR w/left sided PNA. Will order ABG and admit her to the hospital.  Labs Review Labs Reviewed  CBC WITH DIFFERENTIAL - Abnormal; Notable for the following:    RBC 2.80 (*)    Hemoglobin 7.9 (*)    HCT 25.5 (*)    RDW 16.5 (*)    Platelets 142 (*)    Neutrophils Relative % 81 (*)    Lymphs Abs 0.5 (*)    All other components within normal limits  COMPREHENSIVE METABOLIC PANEL - Abnormal; Notable for  the following:    Glucose, Bld 104 (*)    BUN 28 (*)    Creatinine,  Ser 1.67 (*)    Calcium 8.2 (*)    AST 41 (*)    GFR calc non Af Amer 28 (*)    GFR calc Af Amer 32 (*)    All other components within normal limits  URINALYSIS, ROUTINE W REFLEX MICROSCOPIC - Abnormal; Notable for the following:    Hgb urine dipstick MODERATE (*)    Ketones, ur TRACE (*)    Protein, ur 30 (*)    All other components within normal limits  URINE MICROSCOPIC-ADD ON - Abnormal; Notable for the following:    Bacteria, UA FEW (*)    All other components within normal limits  BLOOD GAS, ARTERIAL - Abnormal; Notable for the following:    pCO2 arterial 31.3 (*)    pO2, Arterial 68.9 (*)    Bicarbonate 19.2 (*)    Acid-base deficit 4.7 (*)    All other components within normal limits  CG4 I-STAT (LACTIC ACID) - Abnormal; Notable for the following:    Lactic Acid, Venous 2.83 (*)    All other components within normal limits  CULTURE, BLOOD (ROUTINE X 2)  CULTURE, BLOOD (ROUTINE X 2)  URINE CULTURE   Imaging Review Dg Chest Port 1 View  20-Apr-2013   CLINICAL DATA:  Cough and fever  EXAM: PORTABLE CHEST - 1 VIEW  COMPARISON:  10/07/2012  FINDINGS: There is left lower lobe infiltrate consistent with pneumonia. There is probably a small amount of left pleural fluid. There is chronic pleural and parenchymal scarring at the lung apices right more than left. Left apex not well seen because of the overlying head shadow. No evidence of pulmonary edema.  IMPRESSION: Left lower lobe pneumonia.   Electronically Signed   By: Paulina Fusi M.D.   On: April 20, 2013 09:52   EKG Interpretation   None      MDM   Final diagnoses:  Community acquired pneumonia   Fever with pneumonia. She'll need to be admitted for stabilization and further treatment  Nursing Notes Reviewed/ Care Coordinated, and agree without changes. Applicable Imaging Reviewed.  Interpretation of Laboratory Data incorporated into ED treatment   I  personally performed the services described in this documentation, which was scribed in my presence. The recorded information has been reviewed and is accurate.       [name], MD 20-Apr-2013 1524

## 2013-03-25 NOTE — ED Notes (Addendum)
Pt has a really bad cough and a fever. Pt normally favors 1 side and keeps saliva in her mouth until she starts to drool.

## 2013-03-26 DIAGNOSIS — N289 Disorder of kidney and ureter, unspecified: Secondary | ICD-10-CM

## 2013-03-26 DIAGNOSIS — E875 Hyperkalemia: Secondary | ICD-10-CM | POA: Diagnosis not present

## 2013-03-26 DIAGNOSIS — N189 Chronic kidney disease, unspecified: Secondary | ICD-10-CM | POA: Diagnosis present

## 2013-03-26 LAB — URINE CULTURE
Colony Count: NO GROWTH
Culture: NO GROWTH

## 2013-03-26 LAB — LEGIONELLA ANTIGEN, URINE: LEGIONELLA ANTIGEN, URINE: NEGATIVE

## 2013-03-26 LAB — IRON AND TIBC
Iron: 12 ug/dL — ABNORMAL LOW (ref 42–135)
SATURATION RATIOS: 4 % — AB (ref 20–55)
TIBC: 278 ug/dL (ref 250–470)
UIBC: 266 ug/dL (ref 125–400)

## 2013-03-26 LAB — CBC
HCT: 27.4 % — ABNORMAL LOW (ref 36.0–46.0)
HEMOGLOBIN: 8 g/dL — AB (ref 12.0–15.0)
MCH: 27.8 pg (ref 26.0–34.0)
MCHC: 29.2 g/dL — ABNORMAL LOW (ref 30.0–36.0)
MCV: 95.1 fL (ref 78.0–100.0)
Platelets: 169 10*3/uL (ref 150–400)
RBC: 2.88 MIL/uL — AB (ref 3.87–5.11)
RDW: 17.3 % — ABNORMAL HIGH (ref 11.5–15.5)
WBC: 14.5 10*3/uL — ABNORMAL HIGH (ref 4.0–10.5)

## 2013-03-26 LAB — FOLATE: Folate: 9 ng/mL

## 2013-03-26 LAB — BASIC METABOLIC PANEL
BUN: 33 mg/dL — ABNORMAL HIGH (ref 6–23)
CO2: 12 mEq/L — ABNORMAL LOW (ref 19–32)
Calcium: 7.7 mg/dL — ABNORMAL LOW (ref 8.4–10.5)
Chloride: 111 mEq/L (ref 96–112)
Creatinine, Ser: 1.44 mg/dL — ABNORMAL HIGH (ref 0.50–1.10)
GFR calc Af Amer: 39 mL/min — ABNORMAL LOW (ref >90)
GFR calc non Af Amer: 33 mL/min — ABNORMAL LOW (ref >90)
GLUCOSE: 64 mg/dL — AB (ref 70–99)
POTASSIUM: 5.6 meq/L — AB (ref 3.7–5.3)
SODIUM: 147 meq/L (ref 137–147)

## 2013-03-26 LAB — STREP PNEUMONIAE URINARY ANTIGEN: Strep Pneumo Urinary Antigen: POSITIVE — AB

## 2013-03-26 LAB — VITAMIN B12: VITAMIN B 12: 219 pg/mL (ref 211–911)

## 2013-03-26 LAB — FERRITIN: FERRITIN: 257 ng/mL (ref 10–291)

## 2013-03-26 MED ORDER — OSELTAMIVIR PHOSPHATE 6 MG/ML PO SUSR
30.0000 mg | Freq: Every day | ORAL | Status: DC
  Filled 2013-03-26 (×5): qty 5

## 2013-03-26 MED ORDER — SODIUM CHLORIDE 0.9 % IV SOLN: INTRAVENOUS | Status: AC

## 2013-03-26 MED ORDER — LORAZEPAM 2 MG/ML IJ SOLN: 0.5000 mg | INTRAMUSCULAR | Status: DC | PRN

## 2013-03-26 MED ORDER — ACETAMINOPHEN 650 MG RE SUPP: 650.0000 mg | RECTAL | Status: DC | PRN

## 2013-03-26 MED ORDER — ENOXAPARIN SODIUM 30 MG/0.3ML ~~LOC~~ SOLN: 30.0000 mg | SUBCUTANEOUS | Status: DC

## 2013-03-26 MED ORDER — MORPHINE SULFATE 2 MG/ML IJ SOLN
1.0000 mg | INTRAMUSCULAR | Status: DC | PRN
  Administered 2013-03-26: 1 mg via INTRAVENOUS
  Administered 2013-03-27: 2 mg via INTRAVENOUS
  Administered 2013-03-27 (×2): 1 mg via INTRAVENOUS
  Administered 2013-03-27 (×2): 2 mg via INTRAVENOUS
  Filled 2013-03-26 (×6): qty 1

## 2013-03-26 MED ORDER — SODIUM POLYSTYRENE SULFONATE 15 GM/60ML PO SUSP
30.0000 g | Freq: Once | ORAL | Status: AC
  Administered 2013-03-26: 30 g via RECTAL
  Filled 2013-03-26: qty 120

## 2013-03-26 MED ORDER — ACETYLCYSTEINE 20 % IN SOLN
3.0000 mL | RESPIRATORY_TRACT | Status: DC
  Administered 2013-03-26 (×4): 3 mL via RESPIRATORY_TRACT
  Administered 2013-03-27: 16:00:00 via RESPIRATORY_TRACT
  Administered 2013-03-27: 3 mL via RESPIRATORY_TRACT
  Administered 2013-03-27: 12:00:00 via RESPIRATORY_TRACT
  Administered 2013-03-27 – 2013-03-28 (×2): 3 mL via RESPIRATORY_TRACT
  Administered 2013-03-28: 07:00:00 via RESPIRATORY_TRACT
  Administered 2013-03-28: 3 mL via RESPIRATORY_TRACT
  Filled 2013-03-26 (×12): qty 4

## 2013-03-26 MED ORDER — OSELTAMIVIR PHOSPHATE 6 MG/ML PO SUSR: 75.0000 mg | Freq: Two times a day (BID) | ORAL | Status: DC

## 2013-03-26 MED ORDER — IPRATROPIUM-ALBUTEROL 0.5-2.5 (3) MG/3ML IN SOLN
3.0000 mL | RESPIRATORY_TRACT | Status: DC
Start: 2013-03-26 — End: 2013-03-28
  Administered 2013-03-26 – 2013-03-28 (×12): 3 mL via RESPIRATORY_TRACT
  Filled 2013-03-26 (×12): qty 3

## 2013-03-26 MED ORDER — MORPHINE SULFATE 2 MG/ML IJ SOLN: 1.0000 mg | INTRAMUSCULAR | Status: DC | PRN

## 2013-03-26 NOTE — Progress Notes (Signed)
Speech Language Pathology Treatment: Dysphagia  Patient Details Name: Michele RakeMinnie C Baker MRN: [id number] DOB: 1930-11-04 Today's Date: 03/26/2013 Time: 3:15 PM  - 3:20 PM    Assessment / Plan / Recommendation Clinical Impression  Although [name] appears alert with her eyes open, her breathing is audible and gurgly. RN reports that pt was deep suctioned recently. SLP attempted to provide comfort by touch and elevating HOB, however pt continues to struggle to breathe. RN has paged Dr. Juanetta GoslingHawkins. Pt continues to be inappropriate for po intake at this time. NPO   HPI HPI: [name] is a 80 y.o. female [facility] with a past medical history of advanced dementia, hypertension, dysphasia, stroke, chronic kidney disease stage II, urinary retention, depression with anxiety, presents to the emergency department with the chief complaint of cough and altered mental status. Her caregivers noticed that she was less alert today than usual. In addition she has had a cough for the past 3 days which will seemed worse this morning. Family is at bedside (son and daughter in law) and report that pt had a fever. It is reported that she is typically bedbound but will get out of bed on occasion for meals. Chest x-ray shows left lower lobe pneumonia.   Pertinent Vitals Nasal cannulae   SLP Plan   Re-eval if/when appropriate   Recommendations  NPO including meds                   [name] 03/26/2013, 3:29 PM

## 2013-03-26 NOTE — Progress Notes (Signed)
Mouth care done at this time. Temp maintaining 99.4-99.8. Will continue to monitor.

## 2013-03-26 NOTE — Clinical Social Work Psychosocial (Signed)
Clinical Social Work Department BRIEF PSYCHOSOCIAL ASSESSMENT 03/26/2013  Patient:  [name],[name]     Account Number:  [id number]     Admit date:  03/12/2013  Clinical Social Worker:  Nancie NeasSTULTZ,Yarnell Arvidson, LCSW  Date/Time:  03/26/2013 03:08 PM  Referred by:  Physician  Date Referred:  03/26/2013 Referred for  ALF Placement   Other Referral:   Interview type:  Family Other interview type:   son- [name]    PSYCHOSOCIAL DATA Living Status:  FACILITY Admitted from facility:   Level of care:  Family Care Home Primary support name:  [name] Primary support relationship to patient:  CHILD, ADULT Degree of support available:   supportive    CURRENT CONCERNS Current Concerns  Post-Acute Placement   Other Concerns:    SOCIAL WORK ASSESSMENT / PLAN CSW spoke with pt's son, [name] on phone. [facility] She has been a resident there since 2012. [name] reports he is POA and appears to be involved and supportive. He states that he was here yesterday and spoke with MD. He reports he is aware of pt's poor prognosis. CSW provided brief support. Michele RothmanDaryl has been very [facility] and said he could never repay them for all they have done for her. He is agreeable to updates being provided to facility. CSW spoke with [name] at facility. She states pt is a total assist and is primarily wheelchair bound. Pt has dementia. She is able to communicate at times, particularly in the mornings per [name]. They are willing to accept pt back at d/Baker.   Assessment/plan status:  Psychosocial Support/Ongoing Assessment of Needs Other assessment/ plan:   Information/referral to community resources:   will continue to assess    PATIENT'S/FAMILY'S RESPONSE TO PLAN OF CARE: Pt unable to participate in plan of care. Son states he is aware of pt's poor prognosis and that she may not survive hospitalization. CSW will continue to follow.       Michele FennelKara  Ruffus Kamaka, Kentucky[place]CS[place] [phone]

## 2013-03-26 NOTE — Progress Notes (Signed)
Discussed patients hydrocodone allergy with physician. Will give morphine for comfort.

## 2013-03-26 NOTE — Progress Notes (Signed)
Nasal suctioned large amounts of tan secretions from patient.

## 2013-03-26 NOTE — Progress Notes (Signed)
80 yo F admitted with sepsis, PNA.  She was + Influenza A and is starting on Tamiflu.  Her estimated CrCl ~25 ml/min based on Scr 1.44 & wt = 50kg.   Medications will be adjusted per manufacturer recommendations for CrCl <30. 1.  Tamiflu 30mg  po daily x 5 days 2.  Decrease Lovenox 30mg  sq daily  Junita PushMichelle Chanette Demo, PharmD, BCPS 03/26/2013@9 :32 AM

## 2013-03-26 NOTE — Progress Notes (Signed)
Tried to adjust cooling pad temperature from 70F to 98.155F but the patient temp began to rise again to 99.55F so I turned it back down. Patient seems to be bleeding from either the rectum or vaginal area but I could not tell because she had a bowel movement formed however liquid help between her legs was bloody and patient is stiff and would not open her legs. Also there was no pad/chuck under the patient on the cooling pad so there was stool on the pad. We bathed her and cleaned the pad the best we could and placed a top sheet under her for cover. Will give report to the same nurse who had her yesterday am and placed her on the pad and make her aware of that we could not fully clean it all the way or replace it. Helmut MusterAlicia RN

## 2013-03-26 NOTE — Progress Notes (Signed)
Subjective: She was admitted yesterday with sepsis and pneumonia. She is very congested. She's required cooling blanket.  Objective: Vital signs in last 24 hours: Temp:  [97.2 F (36.2 C)-103.3 F (39.6 C)] 97.2 F (36.2 C) (02/17 0516) Pulse Rate:  [84-105] 104 (02/17 0456) Resp:  [22-28] 22 (02/17 0516) BP: (94-157)/(36-84) 157/84 mmHg (02/17 0516) SpO2:  [88 %-98 %] 98 % (02/17 0516) Weight change:  Last BM Date:  (unable to assess)  Intake/Output from previous day: 02/16 0701 - 02/17 0700 In: -  Out: 600 [Urine:600]  PHYSICAL EXAM General appearance: She is sleepy but does open her eyes. She has gurgling respirations that can be heard across the room Resp: rhonchi bilaterally Cardio: regular rate and rhythm, S1, S2 normal, no murmur, click, rub or gallop GI: soft, non-tender; bowel sounds normal; no masses,  no organomegaly Extremities: She has sequential compression devices on  Lab Results:    Basic Metabolic Panel:  Recent Labs  16/10/96 1009 03/26/13 0613  NA 145 147  K 4.6 5.6*  CL 107 111  CO2 21 12*  GLUCOSE 104* 64*  BUN 28* 33*  CREATININE 1.67* 1.44*  CALCIUM 8.2* 7.7*   Liver Function Tests:  Recent Labs  03/21/2013 1009  AST 41*  ALT 14  ALKPHOS 92  BILITOT 0.3  PROT 7.0  ALBUMIN 3.6   No results found for this basename: LIPASE, AMYLASE,  in the last 72 hours No results found for this basename: AMMONIA,  in the last 72 hours CBC:  Recent Labs  03/31/2013 1009 03/26/13 0613  WBC 4.1 14.5*  NEUTROABS 3.3  --   HGB 7.9* 8.0*  HCT 25.5* 27.4*  MCV 91.1 95.1  PLT 142* 169   Cardiac Enzymes: No results found for this basename: CKTOTAL, CKMB, CKMBINDEX, TROPONINI,  in the last 72 hours BNP: No results found for this basename: PROBNP,  in the last 72 hours D-Dimer: No results found for this basename: DDIMER,  in the last 72 hours CBG: No results found for this basename: GLUCAP,  in the last 72 hours Hemoglobin A1C: No results found  for this basename: HGBA1C,  in the last 72 hours Fasting Lipid Panel: No results found for this basename: CHOL, HDL, LDLCALC, TRIG, CHOLHDL, LDLDIRECT,  in the last 72 hours Thyroid Function Tests: No results found for this basename: TSH, T4TOTAL, FREET4, T3FREE, THYROIDAB,  in the last 72 hours Anemia Panel:  Recent Labs  03/28/2013 1625  VITAMINB12 219  FOLATE 9.0  FERRITIN 257  TIBC 278  IRON 12*  RETICCTPCT 1.9   Coagulation: No results found for this basename: LABPROT, INR,  in the last 72 hours Urine Drug Screen: Drugs of Abuse  No results found for this basename: labopia, cocainscrnur, labbenz, amphetmu, thcu, labbarb    Alcohol Level: No results found for this basename: ETH,  in the last 72 hours Urinalysis:  Recent Labs  03/22/2013 1044  COLORURINE YELLOW  LABSPEC 1.025  PHURINE 5.5  GLUCOSEU NEGATIVE  HGBUR MODERATE*  BILIRUBINUR NEGATIVE  KETONESUR TRACE*  PROTEINUR 30*  UROBILINOGEN 0.2  NITRITE NEGATIVE  LEUKOCYTESUR NEGATIVE   Misc. Labs:  ABGS  Recent Labs  03/12/2013 1255  PHART 7.403  PO2ART 68.9*  TCO2 18.5  HCO3 19.2*   CULTURES Recent Results (from the past 240 hour(s))  CULTURE, BLOOD (ROUTINE X 2)     Status: None   Collection Time    03/26/2013 10:09 AM      Result Value Ref Range Status  Specimen Description Blood   Final   Special Requests NONE   Final   Culture NO GROWTH <24 HRS   Final   Report Status PENDING   Incomplete  CULTURE, BLOOD (ROUTINE X 2)     Status: None   Collection Time    03/16/2013 10:09 AM      Result Value Ref Range Status   Specimen Description Blood   Final   Special Requests NONE   Final   Culture NO GROWTH <24 HRS   Final   Report Status PENDING   Incomplete   Studies/Results: Dg Chest Port 1 View  03/30/2013   CLINICAL DATA:  Cough and fever  EXAM: PORTABLE CHEST - 1 VIEW  COMPARISON:  10/07/2012  FINDINGS: There is left lower lobe infiltrate consistent with pneumonia. There is probably a small  amount of left pleural fluid. There is chronic pleural and parenchymal scarring at the lung apices right more than left. Left apex not well seen because of the overlying head shadow. No evidence of pulmonary edema.  IMPRESSION: Left lower lobe pneumonia.   Electronically Signed   By: Paulina FusiMark  Shogry M.D.   On: 03/12/2013 09:52    Medications:  Prior to Admission:  Prescriptions prior to admission  Medication Sig Dispense Refill  . acetaminophen (TYLENOL) 500 MG tablet Take 500 mg by mouth 2 (two) times daily.      Marland Kitchen. ALPRAZolam (XANAX) 0.5 MG tablet Take 0.5 mg by mouth at bedtime.      Marland Kitchen. aspirin EC 81 MG tablet Take 81 mg by mouth daily.        . cephALEXin (KEFLEX) 250 MG capsule Take 250 mg by mouth daily. Continuous for cellulitis      . loratadine (CLARITIN) 10 MG tablet Take 10 mg by mouth daily. Patient takes at 2pm (Patient started on 02/15/11)      . meloxicam (MOBIC) 7.5 MG tablet Take 7.5 mg by mouth 2 (two) times daily.      . QUEtiapine (SEROQUEL) 25 MG tablet Take 25 mg by mouth at bedtime.       . senna (SENNA LAXATIVE) 8.6 MG tablet Take 1 tablet by mouth daily.      . sertraline (ZOLOFT) 50 MG tablet Take 50 mg by mouth every morning.        Scheduled: . acetylcysteine  3 mL Nebulization Q4H  . ALPRAZolam  0.5 mg Oral QHS  . azithromycin  500 mg Intravenous Q24H  . cefTRIAXone (ROCEPHIN)  IV  1 g Intravenous Q24H  . enoxaparin (LOVENOX) injection  40 mg Subcutaneous Q24H  . ipratropium-albuterol  3 mL Nebulization Q4H  . sertraline  50 mg Oral BH-q7a  . sodium chloride  3 mL Intravenous Q12H  . sodium polystyrene  30 g Rectal Once   Continuous: . sodium chloride     VHQ:[id number]:acetaminophen, acetaminophen, food thickener, LORazepam, ondansetron (ZOFRAN) IV, ondansetron  Assesment: She has community-acquired pneumonia and sepsis from that. She is still having fever and is still very congested  She has dementia and her swallowing is poor at baseline and swallowing evaluation at  bedside yesterday showed that she should be n.p.o. for now  She has hyperkalemia which will need to be treated.  She has acute on chronic renal insufficiency which is somewhat better.  She has chronic anemia unchanged. Principal Problem:   Sepsis Active Problems:   Hematuria, microscopic   Dementia   Depression with anxiety   Anemia   Community acquired bacterial pneumonia  CKD (chronic kidney disease), stage II   Hyperkalemia   Acute on chronic renal insufficiency    Plan: Discontinue cardiac monitor. She has DO NOT RESUSCITATE status. Continue with cooling blanket as needed. She will have Kayexalate enema. I will make her n.p.o. for now. I'm not sure she's going to survive this    LOS: 1 day   Abed Schar L 03/26/2013, 8:56 AM

## 2013-03-26 NOTE — Progress Notes (Addendum)
Patient given kayexalate enema. Did not retain well. Unable to get patient to swallow tamiflu suspension. Will continue to monitor. Patient temp 99 at this time. Cooling blanket off. Patient being monitored.

## 2013-03-26 NOTE — Progress Notes (Signed)
Utilization Review Complete  

## 2013-03-27 NOTE — Progress Notes (Signed)
SLP Cancellation Note  Patient Details Name: [name] MRN: [id number] DOB: 1930-03-29   Cancelled treatment:        SLP f/u with pt, who continues to be at increased risk for aspiration given any PO intake. Continue with NPO; SLP to continue to follow for possibility of PO intake should pt improve.    Aneka Fagerstrom S 03/27/2013, 9:41 AM

## 2013-03-27 NOTE — Progress Notes (Signed)
INITIAL NUTRITION ASSESSMENT  DOCUMENTATION CODES Per approved criteria  -Not Applicable   INTERVENTION: Will follow for diet advancement and goals of care  NUTRITION DIAGNOSIS: Inadequate oral intake related to dysphagia, poor respiratory status as evidenced by NPO.   Goal: Pt will meet nutrition needs as able  Monitor:  Diet advancement, goals of care  Reason for Assessment: MST=3, Low braden=11  80 y.o. female  Admitting Dx: Sepsis  ASSESSMENT: [facility]  Pt remains NPO and SLP evaluation reveals pt is at increased risk for aspiration with any PO intake. Pt also with very poor respiratory status and very poor prognosis.  Wt hx reveals distant hx of wt gain. UBW: 110#.  Will continue to follow for goals of care.   Height: Ht Readings from Last 1 Encounters:  09/01/11 5\' 6"  (1.676 m)    Weight: Wt Readings from Last 1 Encounters:  03/26/13 151 lb 3.8 oz (68.6 kg)    Ideal Body Weight: 130#  % Ideal Body Weight: 116%  Wt Readings from Last 10 Encounters:  03/26/13 151 lb 3.8 oz (68.6 kg)  09/01/11 110 lb (49.896 kg)  02/19/11 110 lb (49.896 kg)  12/03/10 119 lb 0.8 oz (54 kg)  10/14/10 109 lb (49.442 kg)  09/30/10 105 lb (47.628 kg)  08/06/07 106 lb (48.081 kg)    Usual Body Weight: 110#  % Usual Body Weight: 137%  BMI:  Body mass index is 24.42 kg/(m^2). Meets criteria for normal weight.   Estimated Nutritional Needs: Kcal: 1100-1200 daily Protein: 55-69 grams daily Fluid: 1.1-1.2 L daily  Skin: skin tear on hand, deep tissue injury on lt medial ankle  Diet Order: NPO  EDUCATION NEEDS: -Education not appropriate at this time   Intake/Output Summary (Last 24 hours) at 03/27/13 1524 Last data filed at 03/27/13 1455  Gross per 24 hour  Intake      0 ml  Output    850 ml  Net   -850 ml    Last BM: 03/26/13   Labs:   Recent Labs Lab 03/31/2013 1009 03/26/13 0613  NA 145  147  K 4.6 5.6*  CL 107 111  CO2 21 12*  BUN 28* 33*  CREATININE 1.67* 1.44*  CALCIUM 8.2* 7.7*  GLUCOSE 104* 64*    CBG (last 3)  No results found for this basename: GLUCAP,  in the last 72 hours  Scheduled Meds: . acetylcysteine  3 mL Nebulization Q4H  . ALPRAZolam  0.5 mg Oral QHS  . azithromycin  500 mg Intravenous Q24H  . cefTRIAXone (ROCEPHIN)  IV  1 g Intravenous Q24H  . enoxaparin (LOVENOX) injection  30 mg Subcutaneous Q24H  . ipratropium-albuterol  3 mL Nebulization Q4H  . oseltamivir  30 mg Oral Daily  . sertraline  50 mg Oral BH-q7a  . sodium chloride  3 mL Intravenous Q12H    Continuous Infusions:   Past Medical History  Diagnosis Date  . History of DVT (deep vein thrombosis)     Old LE DVT per doppler 4/12  . Depression   . Dysphagia     Schatzki ring dilatation 07/2006  . Diverticulosis   . Hypertension   . Dementia   . Tachycardia   . Stroke   . Rhabdomyolysis     05/2010 after being found down at home.  . ARF (acute renal failure)     05/2010. Resolved.  Marland Kitchen. PNA (pneumonia)   . CKD (chronic  kidney disease), stage II   . Hemorrhoids   . UTI (lower urinary tract infection)   . Urinary retention     Hx of in 05/2010.  Marland Kitchen Near syncope   . Constipation   . Depression with anxiety 09/30/2010  . Cellulitis   . Hematuria   . Anxiety     Past Surgical History  Procedure Laterality Date  . Hemorroidectomy    . Ovarian cyst removal    . Total hip arthroplasty    . Lumbar back surgery      2004.  Marland Kitchen Cystoscopy N/A 12/20/2012    Procedure: CYSTOSCOPY FLEXIBLE;  Surgeon: Ky Barban, MD;  Location: [facility];  Service: Urology;  Laterality: N/A;    Elizabeht Suto A. Mayford Knife, RD, LDN Pager: [phone]

## 2013-03-27 NOTE — Progress Notes (Signed)
Subjective: She looks more comfortable today. She's not having as much fever. She is however requiring more oxygen. She has been deep suctioned on several occasions and has produced a large amount of purulent sputum  Objective: Vital signs in last 24 hours: Temp:  [98.8 F (37.1 C)-99.4 F (37.4 C)] 98.8 F (37.1 C) (02/18 0534) Pulse Rate:  [61-64] 64 (02/18 0534) Resp:  [22] 22 (02/18 0534) BP: (98-110)/(48-86) 98/86 mmHg (02/18 0534) SpO2:  [61 %-98 %] 79 % (02/18 0700) FiO2 (%):  [50 %-100 %] 100 % (02/18 0700) Weight:  [68.6 kg (151 lb 3.8 oz)] 68.6 kg (151 lb 3.8 oz) (02/17 1438) Weight change:  Last BM Date: 03/26/13  Intake/Output from previous day: 02/17 0701 - 02/18 0700 In: 0  Out: 600 [Urine:600]  PHYSICAL EXAM General appearance: She opens her eyes when I call her name. She is still coughing. She is on a nonrebreather mask Resp: She has rhonchi bilaterally but is much less congested than yesterday Cardio: regular rate and rhythm, S1, S2 normal, no murmur, click, rub or gallop GI: soft, non-tender; bowel sounds normal; no masses,  no organomegaly Extremities: extremities normal, atraumatic, no cyanosis or edema  Lab Results:    Basic Metabolic Panel:  Recent Labs  40/98/11 1009 03/26/13 0613  NA 145 147  K 4.6 5.6*  CL 107 111  CO2 21 12*  GLUCOSE 104* 64*  BUN 28* 33*  CREATININE 1.67* 1.44*  CALCIUM 8.2* 7.7*   Liver Function Tests:  Recent Labs  04/02/2013 1009  AST 41*  ALT 14  ALKPHOS 92  BILITOT 0.3  PROT 7.0  ALBUMIN 3.6   No results found for this basename: LIPASE, AMYLASE,  in the last 72 hours No results found for this basename: AMMONIA,  in the last 72 hours CBC:  Recent Labs  03/21/2013 1009 03/26/13 0613  WBC 4.1 14.5*  NEUTROABS 3.3  --   HGB 7.9* 8.0*  HCT 25.5* 27.4*  MCV 91.1 95.1  PLT 142* 169   Cardiac Enzymes: No results found for this basename: CKTOTAL, CKMB, CKMBINDEX, TROPONINI,  in the last 72 hours BNP: No  results found for this basename: PROBNP,  in the last 72 hours D-Dimer: No results found for this basename: DDIMER,  in the last 72 hours CBG: No results found for this basename: GLUCAP,  in the last 72 hours Hemoglobin A1C: No results found for this basename: HGBA1C,  in the last 72 hours Fasting Lipid Panel: No results found for this basename: CHOL, HDL, LDLCALC, TRIG, CHOLHDL, LDLDIRECT,  in the last 72 hours Thyroid Function Tests: No results found for this basename: TSH, T4TOTAL, FREET4, T3FREE, THYROIDAB,  in the last 72 hours Anemia Panel:  Recent Labs  04/02/2013 1625  VITAMINB12 219  FOLATE 9.0  FERRITIN 257  TIBC 278  IRON 12*  RETICCTPCT 1.9   Coagulation: No results found for this basename: LABPROT, INR,  in the last 72 hours Urine Drug Screen: Drugs of Abuse  No results found for this basename: labopia, cocainscrnur, labbenz, amphetmu, thcu, labbarb    Alcohol Level: No results found for this basename: ETH,  in the last 72 hours Urinalysis:  Recent Labs  03/14/2013 1044  COLORURINE YELLOW  LABSPEC 1.025  PHURINE 5.5  GLUCOSEU NEGATIVE  HGBUR MODERATE*  BILIRUBINUR NEGATIVE  KETONESUR TRACE*  PROTEINUR 30*  UROBILINOGEN 0.2  NITRITE NEGATIVE  LEUKOCYTESUR NEGATIVE   Misc. Labs:  ABGS  Recent Labs  03/21/2013 1255  PHART 7.403  PO2ART 68.9*  TCO2 18.5  HCO3 19.2*   CULTURES Recent Results (from the past 240 hour(s))  CULTURE, BLOOD (ROUTINE X 2)     Status: None   Collection Time    03/22/2013 10:09 AM      Result Value Ref Range Status   Specimen Description BLOOD RIGHT HAND   Final   Special Requests BOTTLES DRAWN AEROBIC AND ANAEROBIC 5CC   Final   Culture NO GROWTH 1 DAY   Final   Report Status PENDING   Incomplete  CULTURE, BLOOD (ROUTINE X 2)     Status: None   Collection Time    03/30/2013 10:09 AM      Result Value Ref Range Status   Specimen Description BLOOD LEFT HAND   Final   Special Requests BOTTLES DRAWN AEROBIC AND ANAEROBIC  8CC   Final   Culture NO GROWTH 1 DAY   Final   Report Status PENDING   Incomplete  URINE CULTURE     Status: None   Collection Time    04/01/2013 10:45 AM      Result Value Ref Range Status   Specimen Description URINE, CATHETERIZED   Final   Special Requests NONE   Final   Culture  Setup Time     Final   Value: 03/26/2013 01:13     Performed at [facility]   Colony Count     Final   Value: NO GROWTH     [facility]formed at [facility]   Culture     Final   Value: NO GROWTH     Performed at [facility]   Report Status 03/26/2013 FINAL   Final   Studies/Results: Dg Chest Port 1 View  04/05/2013   CLINICAL DATA:  Cough and fever  EXAM: PORTABLE CHEST - 1 VIEW  COMPARISON:  10/07/2012  FINDINGS: There is left lower lobe infiltrate consistent with pneumonia. There is probably a small amount of left pleural fluid. There is chronic pleural and parenchymal scarring at the lung apices right more than left. Left apex not well seen because of the overlying head shadow. No evidence of pulmonary edema.  IMPRESSION: Left lower lobe pneumonia.   Electronically Signed   By: Paulina FusiMark  Shogry M.D.   On: 03/19/2013 09:52    Medications:  Prior to Admission:  Prescriptions prior to admission  Medication Sig Dispense Refill  . acetaminophen (TYLENOL) 500 MG tablet Take 500 mg by mouth 2 (two) times daily.      Marland Kitchen. ALPRAZolam (XANAX) 0.5 MG tablet Take 0.5 mg by mouth at bedtime.      Marland Kitchen. aspirin EC 81 MG tablet Take 81 mg by mouth daily.        . cephALEXin (KEFLEX) 250 MG capsule Take 250 mg by mouth daily. Continuous for cellulitis      . loratadine (CLARITIN) 10 MG tablet Take 10 mg by mouth daily. Patient takes at 2pm (Patient started on 02/15/11)      . meloxicam (MOBIC) 7.5 MG tablet Take 7.5 mg by mouth 2 (two) times daily.      . QUEtiapine (SEROQUEL) 25 MG tablet Take 25 mg by mouth at bedtime.       . senna (SENNA LAXATIVE) 8.6 MG tablet Take 1 tablet by mouth daily.      .  sertraline (ZOLOFT) 50 MG tablet Take 50 mg by mouth every morning.        Scheduled: . acetylcysteine  3 mL Nebulization Q4H  .  ALPRAZolam  0.5 mg Oral QHS  . azithromycin  500 mg Intravenous Q24H  . cefTRIAXone (ROCEPHIN)  IV  1 g Intravenous Q24H  . enoxaparin (LOVENOX) injection  30 mg Subcutaneous Q24H  . ipratropium-albuterol  3 mL Nebulization Q4H  . oseltamivir  30 mg Oral Daily  . sertraline  50 mg Oral BH-q7a  . sodium chloride  3 mL Intravenous Q12H   Continuous:  UEA:[id number], acetaminophen, food thickener, LORazepam, morphine injection, ondansetron (ZOFRAN) IV, ondansetron  Assesment: She was admitted with pneumonia influenza A and sepsis. She is on antibiotics. She has dementia. I think she is probably aspirating. She is less congested today but is requiring more oxygen  She has acute on chronic renal insufficiency.  She was hyperkalemic yesterday. This was treated with Kayexalate  She has dementia.   Principal Problem:   Sepsis Active Problems:   Hematuria, microscopic   Dementia   Depression with anxiety   Anemia   Community acquired bacterial pneumonia   CKD (chronic kidney disease), stage II   Hyperkalemia   Acute on chronic renal insufficiency    Plan: Continue treatments. I will recheck laboratory work in the morning. I did not expect she would survive the day yesterday    LOS: 2 days   Kayani Rapaport L 03/27/2013, 8:30 AM

## 2013-03-27 NOTE — Progress Notes (Signed)
pt placed on NRB mask due to spo2 86% on 50% v.mask. Nurse informed of change in o2 therapy.

## 2013-03-27 NOTE — Progress Notes (Signed)
NT Suctioned Pt and a large amount of frank blood was suctioned out. RT is concerned about the amount of blood suctioned out

## 2013-03-27 NOTE — Progress Notes (Signed)
Patient NT suctioned. Unable to pass catheter through right nare, passed catheter twice through left nare and got out large amounts of thick blood tinged and yellow mucus. Patient sounded better for a few minutes then filled right back up with fluid. Patient bites down on oral suction device.

## 2013-03-27 NOTE — Progress Notes (Signed)
Deep suctioning patient attempt in left nare obtained small amount bright red blood.. No more attempts on that side switched over to the right and obtained mod. Amount thick yellow secretions. Pt sounded a lot better but within minute begin to sound rhonchus again. Change o2 to 50% v. Mask because I was unable to pick up spo2 for either time for treatments 

## 2013-03-28 LAB — CBC WITH DIFFERENTIAL/PLATELET
Basophils Absolute: 0 10*3/uL (ref 0.0–0.1)
Basophils Relative: 0 % (ref 0–1)
EOS ABS: 0 10*3/uL (ref 0.0–0.7)
EOS PCT: 0 % (ref 0–5)
HEMATOCRIT: 28.6 % — AB (ref 36.0–46.0)
HEMOGLOBIN: 8.8 g/dL — AB (ref 12.0–15.0)
LYMPHS ABS: 1.8 10*3/uL (ref 0.7–4.0)
LYMPHS PCT: 14 % (ref 12–46)
MCH: 28.6 pg (ref 26.0–34.0)
MCHC: 30.8 g/dL (ref 30.0–36.0)
MCV: 92.9 fL (ref 78.0–100.0)
MONO ABS: 1.2 10*3/uL — AB (ref 0.1–1.0)
MONOS PCT: 10 % (ref 3–12)
Neutro Abs: 9.4 10*3/uL — ABNORMAL HIGH (ref 1.7–7.7)
Neutrophils Relative %: 76 % (ref 43–77)
PLATELETS: 221 10*3/uL (ref 150–400)
RBC: 3.08 MIL/uL — AB (ref 3.87–5.11)
RDW: 18.2 % — ABNORMAL HIGH (ref 11.5–15.5)
WBC: 12.4 10*3/uL — ABNORMAL HIGH (ref 4.0–10.5)

## 2013-03-28 LAB — BASIC METABOLIC PANEL
BUN: 58 mg/dL — ABNORMAL HIGH (ref 6–23)
CO2: 15 meq/L — AB (ref 19–32)
Calcium: 8.2 mg/dL — ABNORMAL LOW (ref 8.4–10.5)
Chloride: 116 mEq/L — ABNORMAL HIGH (ref 96–112)
Creatinine, Ser: 2.79 mg/dL — ABNORMAL HIGH (ref 0.50–1.10)
GFR calc Af Amer: 17 mL/min — ABNORMAL LOW (ref >90)
GFR calc non Af Amer: 15 mL/min — ABNORMAL LOW (ref >90)
GLUCOSE: 117 mg/dL — AB (ref 70–99)
POTASSIUM: 5.2 meq/L (ref 3.7–5.3)
Sodium: 151 mEq/L — ABNORMAL HIGH (ref 137–147)

## 2013-03-28 MED ORDER — SODIUM CHLORIDE 0.45 % IV SOLN
INTRAVENOUS | Status: DC
  Administered 2013-03-28 (×2): via INTRAVENOUS

## 2013-03-28 NOTE — Progress Notes (Signed)
Mouth care done on patient.  Rhonchi audible without a stethoscope even after suctioning. Will continue to monitor.

## 2013-03-28 NOTE — Progress Notes (Signed)
Patient NT suctioned. Moderate amount of thick frank blood yellow tinged mucus produced. No improvement after suctioning.

## 2013-03-28 NOTE — Progress Notes (Signed)
Patient unresponsive, pupils non reactive, respirations even, shallow breath, rhonchi bilaterally, continues on non rebreather @ 100%, continue with NT suction by respiratory, son at bedside, mouth swabbed, patient repositioned.

## 2013-03-28 NOTE — Progress Notes (Signed)
SLP Cancellation Note  Patient Details Name: Michele RakeMinnie C Botts MRN: [id number] DOB: 1930-03-08   Cancelled treatment:       Reason Eval/Treat Not Completed: Fatigue/lethargy limiting ability to participate;Medical issues which prohibited therapy;Patient not medically ready. SLP will sign off at this time, please re-consult should pt become appropriate.  Thank you,  Havery MorosDabney Porter, CCC-SLP [phone]    PORTER,DABNEY 03/28/2013, 3:38 PM

## 2013-03-28 NOTE — Progress Notes (Signed)
Subjective: She continues to have worsening of her condition. I discussed her situation with her son yesterday and he understands the gravity of her problem  Objective: Vital signs in last 24 hours: Temp:  [99 F (37.2 C)-99.2 F (37.3 C)] 99.2 F (37.3 C) (02/18 2324) Pulse Rate:  [118-130] 123 (02/19 0601) Resp:  [22-26] 26 (02/19 0601) BP: (99-143)/(33-64) 99/42 mmHg (02/19 0601) SpO2:  [85 %-100 %] 85 % (02/19 0718) FiO2 (%):  [100 %] 100 % (02/19 0718) Weight change:  Last BM Date: 03/26/13  Intake/Output from previous day: 02/18 0701 - 02/19 0700 In: 0  Out: 500 [Urine:500]  PHYSICAL EXAM General appearance: severe distress and She continues having respiratory distress. She is gurgling respirations. She is poorly responsive Resp: rhonchi bilaterally Cardio: regular rate and rhythm, S1, S2 normal, no murmur, click, rub or gallop GI: soft, non-tender; bowel sounds normal; no masses,  no organomegaly Extremities: extremities normal, atraumatic, no cyanosis or edema  Lab Results:    Basic Metabolic Panel:  Recent Labs  81/85/63 0613 03/28/13 0631  NA 147 151*  K 5.6* 5.2  CL 111 116*  CO2 12* 15*  GLUCOSE 64* 117*  BUN 33* 58*  CREATININE 1.44* 2.79*  CALCIUM 7.7* 8.2*   Liver Function Tests:  Recent Labs  03/14/2013 1009  AST 41*  ALT 14  ALKPHOS 92  BILITOT 0.3  PROT 7.0  ALBUMIN 3.6   No results found for this basename: LIPASE, AMYLASE,  in the last 72 hours No results found for this basename: AMMONIA,  in the last 72 hours CBC:  Recent Labs  03/19/2013 1009 03/26/13 0613 03/28/13 0631  WBC 4.1 14.5* 12.4*  NEUTROABS 3.3  --  9.4*  HGB 7.9* 8.0* 8.8*  HCT 25.5* 27.4* 28.6*  MCV 91.1 95.1 92.9  PLT 142* 169 221   Cardiac Enzymes: No results found for this basename: CKTOTAL, CKMB, CKMBINDEX, TROPONINI,  in the last 72 hours BNP: No results found for this basename: PROBNP,  in the last 72 hours D-Dimer: No results found for this basename:  DDIMER,  in the last 72 hours CBG: No results found for this basename: GLUCAP,  in the last 72 hours Hemoglobin A1C: No results found for this basename: HGBA1C,  in the last 72 hours Fasting Lipid Panel: No results found for this basename: CHOL, HDL, LDLCALC, TRIG, CHOLHDL, LDLDIRECT,  in the last 72 hours Thyroid Function Tests: No results found for this basename: TSH, T4TOTAL, FREET4, T3FREE, THYROIDAB,  in the last 72 hours Anemia Panel:  Recent Labs  03/23/2013 1625  VITAMINB12 219  FOLATE 9.0  FERRITIN 257  TIBC 278  IRON 12*  RETICCTPCT 1.9   Coagulation: No results found for this basename: LABPROT, INR,  in the last 72 hours Urine Drug Screen: Drugs of Abuse  No results found for this basename: labopia, cocainscrnur, labbenz, amphetmu, thcu, labbarb    Alcohol Level: No results found for this basename: ETH,  in the last 72 hours Urinalysis:  Recent Labs  04/01/2013 1044  COLORURINE YELLOW  LABSPEC 1.025  PHURINE 5.5  GLUCOSEU NEGATIVE  HGBUR MODERATE*  BILIRUBINUR NEGATIVE  KETONESUR TRACE*  PROTEINUR 30*  UROBILINOGEN 0.2  NITRITE NEGATIVE  LEUKOCYTESUR NEGATIVE   Misc. Labs:  ABGS  Recent Labs  03/26/2013 1255  PHART 7.403  PO2ART 68.9*  TCO2 18.5  HCO3 19.2*   CULTURES Recent Results (from the past 240 hour(s))  CULTURE, BLOOD (ROUTINE X 2)     Status: None  Collection Time    03/28/2013 10:09 AM      Result Value Ref Range Status   Specimen Description BLOOD RIGHT HAND   Final   Special Requests BOTTLES DRAWN AEROBIC AND ANAEROBIC 5CC   Final   Culture NO GROWTH 2 DAYS   Final   Report Status PENDING   Incomplete  CULTURE, BLOOD (ROUTINE X 2)     Status: None   Collection Time    03/28/2013 10:09 AM      Result Value Ref Range Status   Specimen Description BLOOD LEFT HAND   Final   Special Requests BOTTLES DRAWN AEROBIC AND ANAEROBIC 8CC   Final   Culture NO GROWTH 2 DAYS   Final   Report Status PENDING   Incomplete  URINE CULTURE      Status: None   Collection Time    03/28/2013 10:45 AM      Result Value Ref Range Status   Specimen Description URINE, CATHETERIZED   Final   Special Requests NONE   Final   Culture  Setup Time     Final   Value: 03/26/2013 01:13     Performed at [facility]   Colony Count     Final   Value: NO GROWTH     [facility]formed [facility]   Culture     Final   Value: NO GROWTH     [facility]formed at [facility]   Report Status 03/26/2013 FINAL   Final   Studies/Results: No results found.  Medications:  Prior to Admission:  Prescriptions prior to admission  Medication Sig Dispense Refill  . acetaminophen (TYLENOL) 500 MG tablet Take 500 mg by mouth 2 (two) times daily.      Marland Kitchen. ALPRAZolam (XANAX) 0.5 MG tablet Take 0.5 mg by mouth at bedtime.      Marland Kitchen. aspirin EC 81 MG tablet Take 81 mg by mouth daily.        . cephALEXin (KEFLEX) 250 MG capsule Take 250 mg by mouth daily. Continuous for cellulitis      . loratadine (CLARITIN) 10 MG tablet Take 10 mg by mouth daily. Patient takes at 2pm (Patient started on 02/15/11)      . meloxicam (MOBIC) 7.5 MG tablet Take 7.5 mg by mouth 2 (two) times daily.      . QUEtiapine (SEROQUEL) 25 MG tablet Take 25 mg by mouth at bedtime.       . senna (SENNA LAXATIVE) 8.6 MG tablet Take 1 tablet by mouth daily.      . sertraline (ZOLOFT) 50 MG tablet Take 50 mg by mouth every morning.        Scheduled: . acetylcysteine  3 mL Nebulization Q4H  . ALPRAZolam  0.5 mg Oral QHS  . azithromycin  500 mg Intravenous Q24H  . cefTRIAXone (ROCEPHIN)  IV  1 g Intravenous Q24H  . enoxaparin (LOVENOX) injection  30 mg Subcutaneous Q24H  . ipratropium-albuterol  3 mL Nebulization Q4H  . oseltamivir  30 mg Oral Daily  . sertraline  50 mg Oral BH-q7a  . sodium chloride  3 mL Intravenous Q12H   Continuous:  ZOX:[id number]:acetaminophen, acetaminophen, food thickener, LORazepam, morphine injection, ondansetron (ZOFRAN) IV, ondansetron  Assesment: She was admitted  with community-acquired pneumonia and sepsis. She also was positive for influenza. She now has acute on chronic renal failure multiple electrolyte abnormalities and she is anemic. She is poorly responsive. She has not made any improvement. Principal Problem:   Sepsis  Active Problems:   Hematuria, microscopic   Dementia   Depression with anxiety   Anemia   Community acquired bacterial pneumonia   CKD (chronic kidney disease), stage II   Hyperkalemia   Acute on chronic renal insufficiency    Plan: Continue with current medications. I don't think she's going to survive. I discussed this with her son yesterday    LOS: 3 days   Allyssia Skluzacek L 03/28/2013, 8:59 AM

## 2013-03-28 NOTE — Progress Notes (Signed)
Patient NT suctioned through both the right and left nare. Moderate amount of yellow blood tinged thick mucus produced. Patient still sounds very rhoncus after suctioning.

## 2013-03-28 NOTE — Progress Notes (Signed)
ANTIBIOTIC CONSULT NOTE - INITIAL  Pharmacy Consult for Rocephin Indication: pneumonia  Allergies  Allergen Reactions  . Hydrocodone Rash  . Strawberry Other (See Comments)    Unknown    Patient Measurements: Height: 5\' 4"  (162.6 cm) Weight: 151 lb 3.8 oz (68.6 kg) IBW/kg (Calculated) : 54.7  Vital Signs: Temp: 98.4 F (36.9 C) (02/19 0913) Temp src: Rectal (02/19 0913) BP: 99/42 mmHg (02/19 0601) Pulse Rate: 123 (02/19 0601) Intake/Output from previous day: 02/18 0701 - 02/19 0700 In: 0  Out: 500 [Urine:500] Intake/Output from this shift:    Labs:  Recent Labs  03/26/13 0613 03/28/13 0631  WBC 14.5* 12.4*  HGB 8.0* 8.8*  PLT 169 221  CREATININE 1.44* 2.79*   Estimated Creatinine Clearance: 15.3 ml/min (by C-G formula based on Cr of 2.79). No results found for this basename: VANCOTROUGH, VANCOPEAK, VANCORANDOM, GENTTROUGH, GENTPEAK, GENTRANDOM, TOBRATROUGH, TOBRAPEAK, TOBRARND, AMIKACINPEAK, AMIKACINTROU, AMIKACIN,  in the last 72 hours   Microbiology: Recent Results (from the past 720 hour(s))  CULTURE, BLOOD (ROUTINE X 2)     Status: None   Collection Time    03/15/2013 10:09 AM      Result Value Ref Range Status   Specimen Description BLOOD RIGHT HAND   Final   Special Requests BOTTLES DRAWN AEROBIC AND ANAEROBIC 5CC   Final   Culture NO GROWTH 3 DAYS   Final   Report Status PENDING   Incomplete  CULTURE, BLOOD (ROUTINE X 2)     Status: None   Collection Time    03/21/2013 10:09 AM      Result Value Ref Range Status   Specimen Description BLOOD LEFT HAND   Final   Special Requests BOTTLES DRAWN AEROBIC AND ANAEROBIC 8CC   Final   Culture NO GROWTH 3 DAYS   Final   Report Status PENDING   Incomplete  URINE CULTURE     Status: None   Collection Time    03/17/2013 10:45 AM      Result Value Ref Range Status   Specimen Description URINE, CATHETERIZED   Final   Special Requests NONE   Final   Culture  Setup Time     Final   Value: 03/26/2013 01:13   Performed at [facility] Count     Final   Value: NO GROWTH     Performed at [facility]   Culture     Final   Value: NO GROWTH     Performed at [facility]   Report Status 03/26/2013 FINAL   Final    Medical History: Past Medical History  Diagnosis Date  . History of DVT (deep vein thrombosis)     Old LE DVT per doppler 4/12  . Depression   . Dysphagia     Schatzki ring dilatation 07/2006  . Diverticulosis   . Hypertension   . Dementia   . Tachycardia   . Stroke   . Rhabdomyolysis     05/2010 after being found down at home.  . ARF (acute renal failure)     05/2010. Resolved.  Marland Kitchen PNA (pneumonia)   . CKD (chronic kidney disease), stage II   . Hemorrhoids   . UTI (lower urinary tract infection)   . Urinary retention     Hx of in 05/2010.  Marland Kitchen Near syncope   . Constipation   . Depression with anxiety 09/30/2010  . Cellulitis   . Hematuria   . Anxiety  Medications:  Scheduled:  . acetylcysteine  3 mL Nebulization Q4H  . ALPRAZolam  0.5 mg Oral QHS  . azithromycin  500 mg Intravenous Q24H  . cefTRIAXone (ROCEPHIN)  IV  1 g Intravenous Q24H  . enoxaparin (LOVENOX) injection  30 mg Subcutaneous Q24H  . ipratropium-albuterol  3 mL Nebulization Q4H  . oseltamivir  30 mg Oral Daily  . sertraline  50 mg Oral BH-q7a  . sodium chloride  3 mL Intravenous Q12H   Assessment: 80 yo F admitted with sepsis.  CXR + LLL PNA.  Also + Influenza A PCR.  She continues to decline clinically per MD note.   She has acute on chronic renal function noted.  Rocephin & Zithromax are not renally dose adjusted.  She has been unable to take the oral Tamiflu.    Rocephin 2/17>> Zithromax 2/17>>  Goal of Therapy:  Eradicate infection.  Plan:  Rocephin 1gm IV q24h Zithromax 500mg  IV q24h Continue Tamiflu 30mg  po daily if patient will take Duration of therapy per MD - recommend 7 days of antibiotics for CAP  Pharmacy to sign off    Broden Holt, Mercy RidingAndrea  Michelle 03/28/2013,10:41 AM

## 2013-03-30 LAB — CULTURE, BLOOD (ROUTINE X 2)
CULTURE: NO GROWTH
CULTURE: NO GROWTH

## 2013-04-02 DIAGNOSIS — J09X2 Influenza due to identified novel influenza A virus with other respiratory manifestations: Secondary | ICD-10-CM | POA: Diagnosis present

## 2013-04-02 DIAGNOSIS — E870 Hyperosmolality and hypernatremia: Secondary | ICD-10-CM | POA: Diagnosis not present

## 2013-04-02 NOTE — Discharge Summary (Signed)
Physician Discharge Summary  Patient ID: [name] MRN: [id number] DOB/AGE: 78-Feb-1932 78 y.o. Primary Care Physician:Ahsan Esterline L, MD Admit date: Apr 02, 2013 Discharge date: 04/02/2013    Discharge Diagnoses:   Principal Problem:   Sepsis Active Problems:   Hematuria, microscopic   Dementia   Depression with anxiety   Anemia   Community acquired bacterial pneumonia   CKD (chronic kidney disease), stage II   Hyperkalemia   Acute on chronic renal insufficiency   Hypernatremia   Influenza due to identified novel influenza A virus with other respiratory manifestations     Medication List    ASK your doctor about these medications       acetaminophen 500 MG tablet  Commonly known as:  TYLENOL  Take 500 mg by mouth 2 (two) times daily.     ALPRAZolam 0.5 MG tablet  Commonly known as:  XANAX  Take 0.5 mg by mouth at bedtime.     aspirin EC 81 MG tablet  Take 81 mg by mouth daily.     cephALEXin 250 MG capsule  Commonly known as:  KEFLEX  Take 250 mg by mouth daily. Continuous for cellulitis     loratadine 10 MG tablet  Commonly known as:  CLARITIN  Take 10 mg by mouth daily. Patient takes at 2pm (Patient started on 02/15/11)     meloxicam 7.5 MG tablet  Commonly known as:  MOBIC  Take 7.5 mg by mouth 2 (two) times daily.     QUEtiapine 25 MG tablet  Commonly known as:  SEROQUEL  Take 25 mg by mouth at bedtime.     SENNA LAXATIVE 8.6 MG tablet  Generic drug:  senna  Take 1 tablet by mouth daily.     sertraline 50 MG tablet  Commonly known as:  ZOLOFT  Take 50 mg by mouth every morning.        Discharged Condition: Deceased    Consults: None  Significant Diagnostic Studies: Dg Chest Port 1 View  02-Apr-2013   CLINICAL DATA:  Cough and fever  EXAM: PORTABLE CHEST - 1 VIEW  COMPARISON:  10/07/2012  FINDINGS: There is left lower lobe infiltrate consistent with pneumonia. There is probably a small amount of left pleural fluid. There is chronic  pleural and parenchymal scarring at the lung apices right more than left. Left apex not well seen because of the overlying head shadow. No evidence of pulmonary edema.  IMPRESSION: Left lower lobe pneumonia.   Electronically Signed   By: Paulina Fusi M.D.   On: April 02, 2013 09:52    Lab Results: Basic Metabolic Panel: No results found for this basename: NA, K, CL, CO2, GLUCOSE, BUN, CREATININE, CALCIUM, MG, PHOS,  in the last 72 hours Liver Function Tests: No results found for this basename: AST, ALT, ALKPHOS, BILITOT, PROT, ALBUMIN,  in the last 72 hours   CBC: No results found for this basename: WBC, NEUTROABS, HGB, HCT, MCV, PLT,  in the last 72 hours  Recent Results (from the past 240 hour(s))  CULTURE, BLOOD (ROUTINE X 2)     Status: None   Collection Time    04/02/13 10:09 AM      Result Value Ref Range Status   Specimen Description BLOOD RIGHT HAND   Final   Special Requests BOTTLES DRAWN AEROBIC AND ANAEROBIC 5CC   Final   Culture NO GROWTH 5 DAYS   Final   Report Status 03/30/2013 FINAL   Final  CULTURE, BLOOD (ROUTINE X 2)  Status: None   Collection Time    2013-06-13 10:09 AM      Result Value Ref Range Status   Specimen Description BLOOD LEFT HAND   Final   Special Requests BOTTLES DRAWN AEROBIC AND ANAEROBIC 8CC   Final   Culture NO GROWTH 5 DAYS   Final   Report Status 03/30/2013 FINAL   Final  URINE CULTURE     Status: None   Collection Time    2013-06-13 10:45 AM      Result Value Ref Range Status   Specimen Description URINE, CATHETERIZED   Final   Special Requests NONE   Final   Culture  Setup Time     Final   Value: 03/26/2013 01:13     Performed at [facility]   Colony Count     Final   Value: NO GROWTH     [facility]formed at [facility]   Culture     Final   Value: NO GROWTH     Performed at [facility]   Report Status 03/26/2013 FINAL   Final     Hospital Course: She was admitted from an assisted living facility with cough and  congestion. She was found to have pneumonia. She was also found to have influenza A. At baseline she is demented and she was unresponsive on admission. She was treated with intravenous antibiotics IV fluids et Karie Sodacetera. She developed worsening renal failure. She had hypernatremia and hyperkalemia which were treated. She made no improvement. Discussions with her family were made and they understood the gravity of her situation and she had no CODE BLUE status. Despite all efforts she died on  Discharge Exam: Blood pressure 85/40, pulse 112, temperature 97.2 F (36.2 C), temperature source Axillary, resp. rate 28, height 5\' 4"  (1.626 m), weight 68.6 kg (151 lb 3.8 oz), SpO2 90.00%. Not applicable  Disposition: Released to funeral home      Signed: Weylyn Ricciuti L   04/02/2013, 8:19 AM

## 2013-04-03 NOTE — Discharge Summary (Signed)
NAMBaruch Goldmann:  Pongratz, Denene               ACCOUNT NO.:  [id number]  MEDICAL RECORD NO.:  [id number]  LOCATION:                                 FACILITY:  PHYSICIAN:  Consandra Laske L. Juanetta GoslingHawkins, M.D.DATE OF BIRTH:  12/08/1930  DATE OF ADMISSION:  03/15/2013 DATE OF DISCHARGE:  04-28-15LH                              DISCHARGE SUMMARY   ADDENDUM:  DISCHARGE DIAGNOSIS:  Acute respiratory failure.     Vasily Fedewa L. Juanetta GoslingHawkins, M.D.     ELH/MEDQ  D:  04/03/2013  T:  04/03/2013  Job:  [id number]

## 2013-04-07 NOTE — Progress Notes (Signed)
Found patient without a pulse and not breathing.  Unable to assess a blood pressure.  Pronounced dead by two RNs Candelaria Stagers(Payslie Mccaig Anne Schonewitz and Onnie BoerJennifer Coffey).  MD notified.  Daughter-in-law notified and stated she would call her husband (patient's son) at work to inform him.  House supervisor notified.  Schonewitz, Candelaria StagersLeigh Anne 03/18/2013

## 2013-04-07 NOTE — Progress Notes (Signed)
[facility] notified.  Declined patient for organ donation.  Patient prepped for funeral home.  Will notify family when funeral home arrives per son's request.  Schonewitz, Candelaria StagersLeigh Anne 05-03-2013

## 2013-04-07 NOTE — Care Management Note (Addendum)
    Page 1 of 1   04/06/2013     3:32:25 PM   CARE MANAGEMENT NOTE 03/22/2013  Patient:  Michele RakeMARTIN,[name]   Account Number:  [id number]  Date Initiated:  03/26/2013  Documentation initiated by:  Rosemary HolmsOBSON,Kirstyn Lean  Subjective/Objective Assessment:   Pt admitted from [facility]. Pt doing poorly. CSW following for appropriate DC     Action/Plan:   Anticipated DC Date:     Anticipated DC Plan:    In-house referral  Clinical Social Worker      DC Planning Services  CM consult      Choice offered to / List presented to:             Status of service:  Completed, signed off Medicare Important Message given?   (If response is "NO", the following Medicare IM given date fields will be blank) Date Medicare IM given:   Date Additional Medicare IM given:    Discharge Disposition:  EXPIRED  Per UR Regulation:    If discussed at Long Length of Stay Meetings, dates discussed:    Comments:  04/03/2013 Rosemary HolmsAmy Naseem Adler RN BSN CM Pt expired  03/26/13 Rosemary HolmsAmy Kristyl Athens RN BSN CM

## 2013-04-07 DEATH — deceased

## 2013-12-09 ENCOUNTER — Encounter (HOSPITAL_COMMUNITY): Payer: Self-pay | Admitting: Emergency Medicine

## 2014-12-11 IMAGING — CR DG CHEST 1V PORT
1 series · 1 of 1 positions shown · non-contrast
Comparison: 09/01/2011

CLINICAL DATA: Cough, fever and weakness.

PORTABLE CHEST - 1 VIEW

[portable]
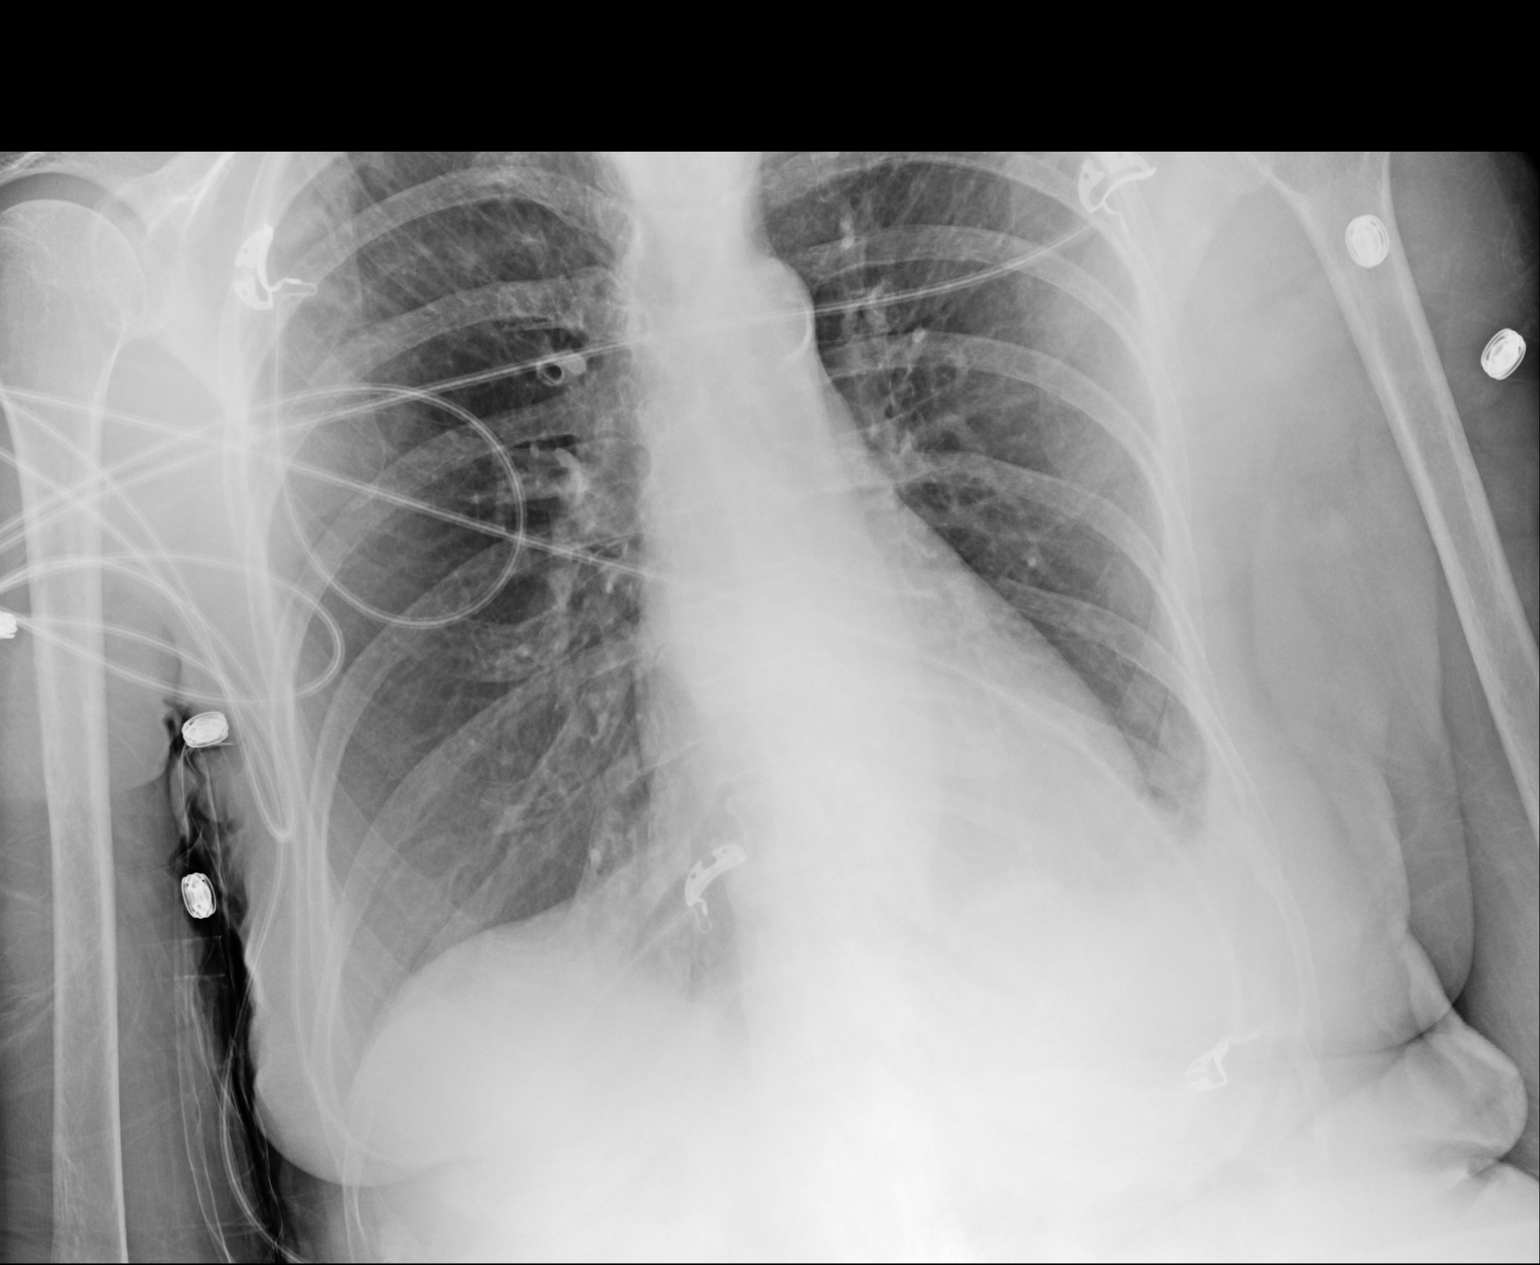

[1 of 1 positions shown; findings below may reference images not displayed]

FINDINGS: No focal pulmonary consolidation is seen.  There likely
is a small left pleural effusion.  No pulmonary edema is
identified.  Heart size and mediastinal contours are within normal
limits.
IMPRESSION: Probable small left pleural effusion.  No focal infiltrate is seen.

## 2015-02-16 IMAGING — US US RENAL
1 series · 14 of 25 positions shown · non-contrast
Comparison: 06/16/2010

CLINICAL DATA: UTI, gross hematuria

EXAM:
RENAL/URINARY TRACT ULTRASOUND COMPLETE

[Series 1: us renal · 0.18mm/px · 14 of 40 slices shown]
[im 1/40]
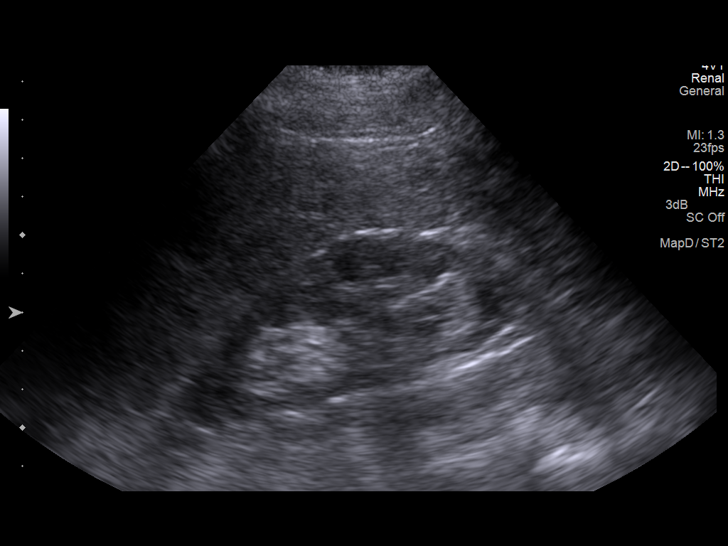
[im 4/40]
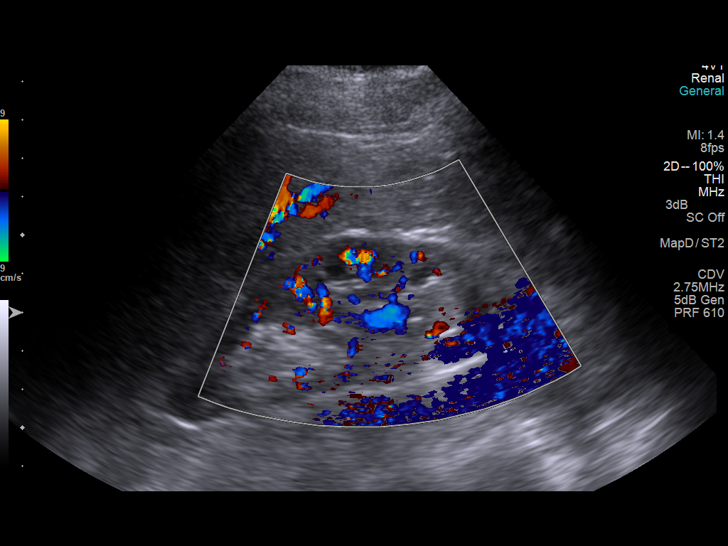
[im 7/40]
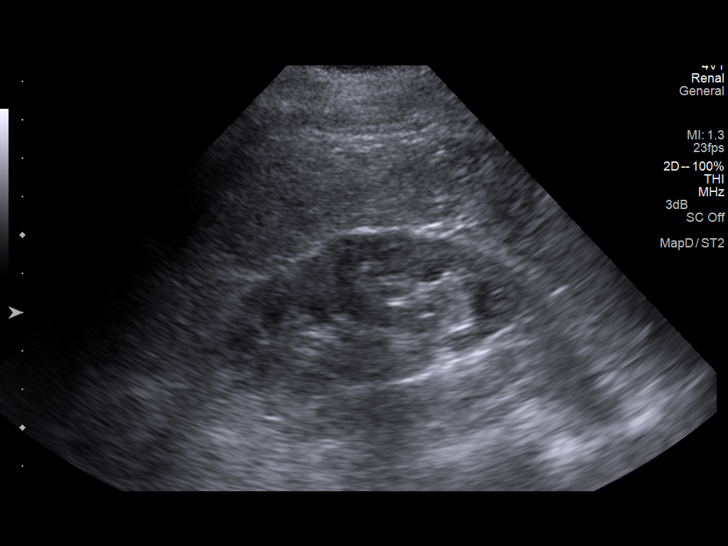
[im 10/40]
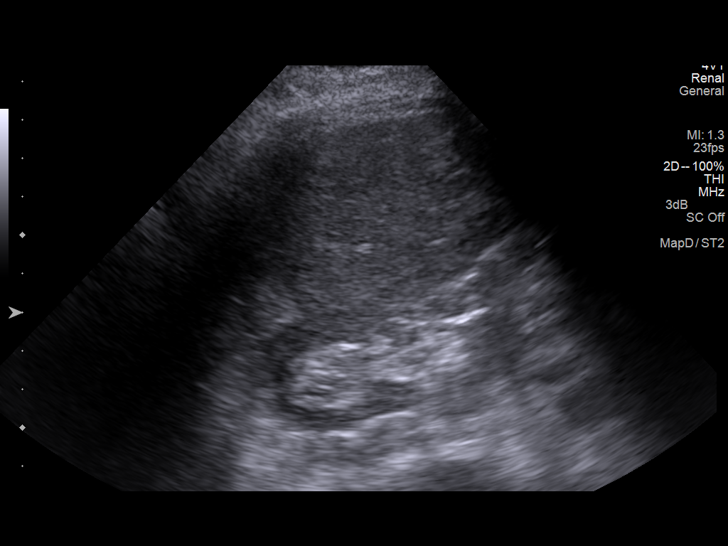
[im 14/40]
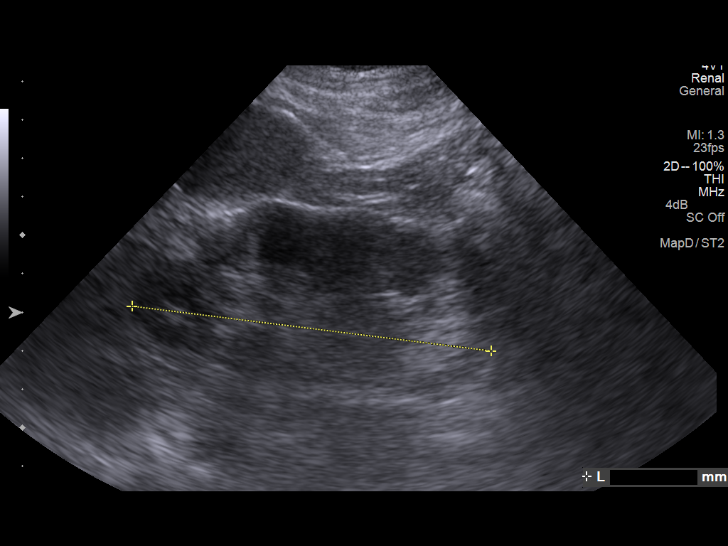
[im 15/40]
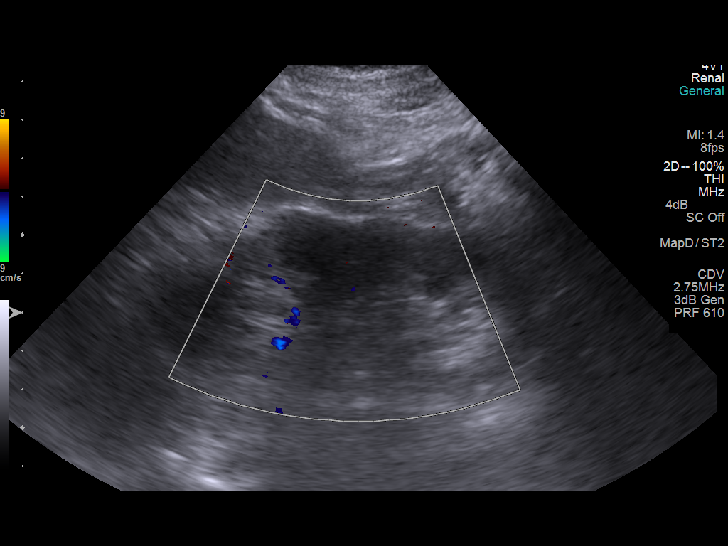
[im 18/40]
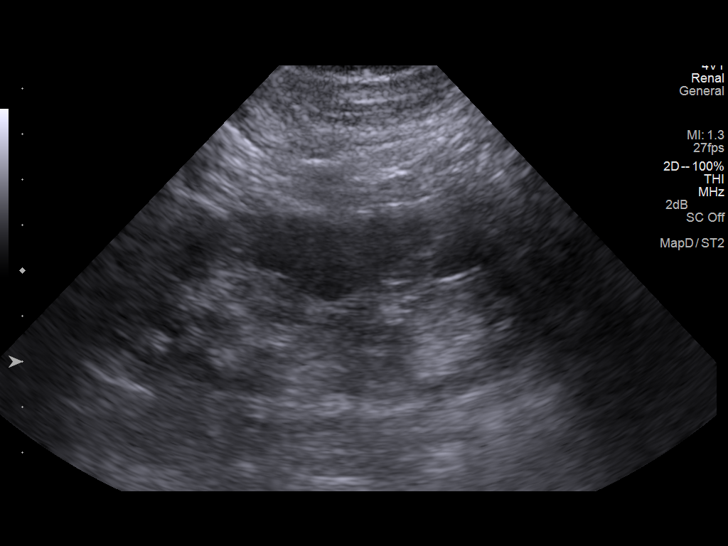
[im 22/40]
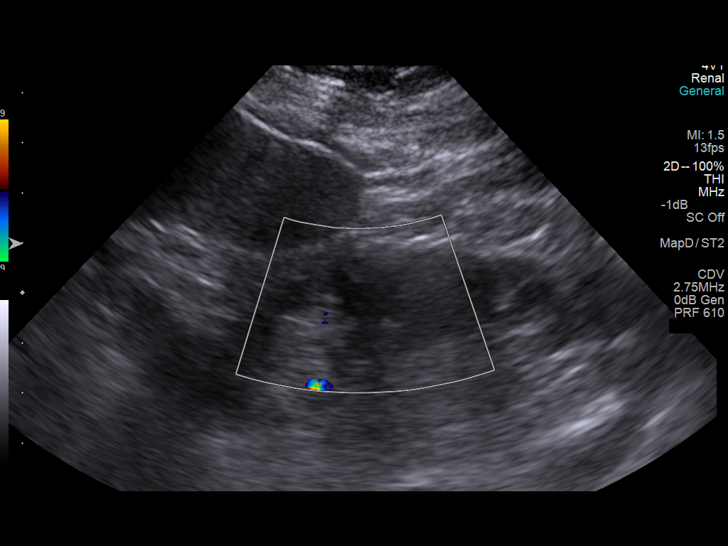
[im 25/40]
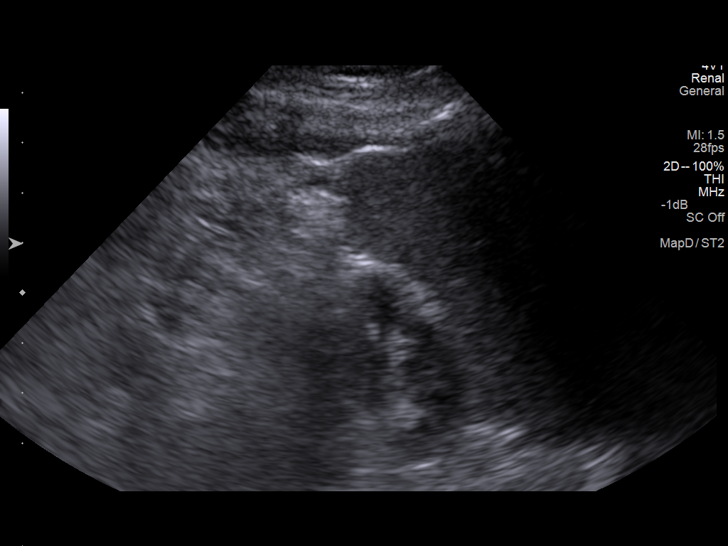
[im 27/40]
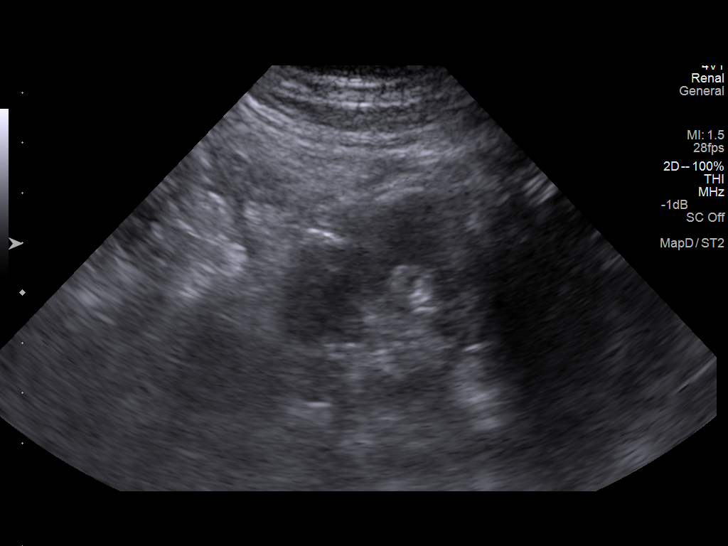
[im 30/40]
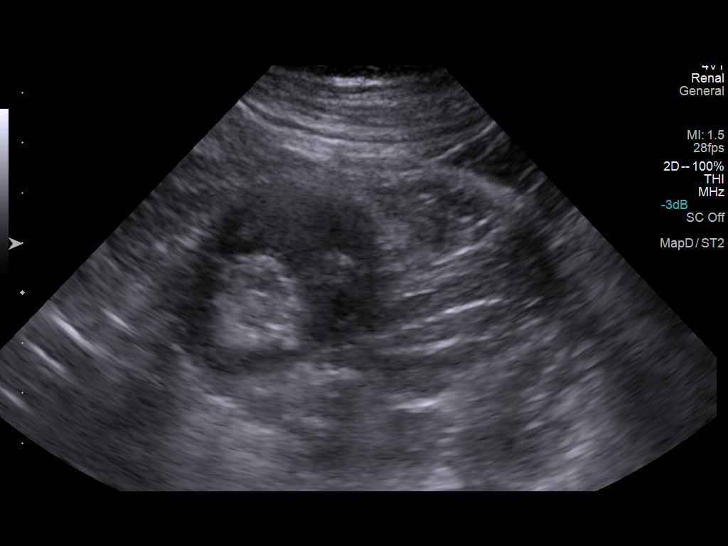
[im 33/40]
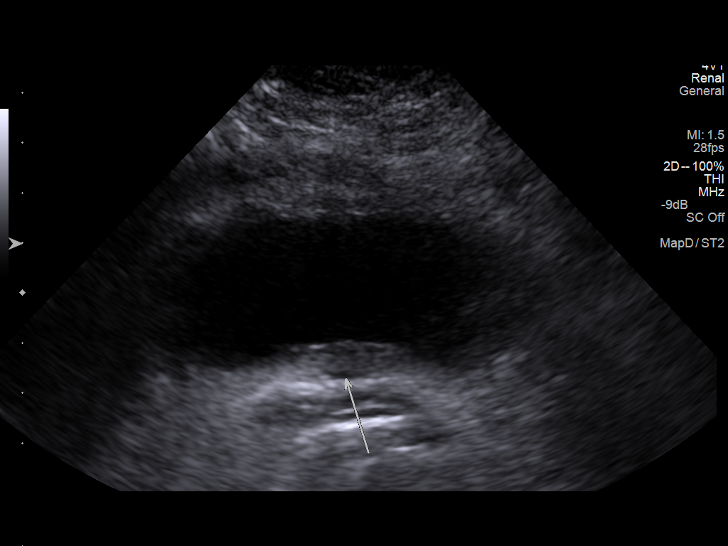
[im 36/40]
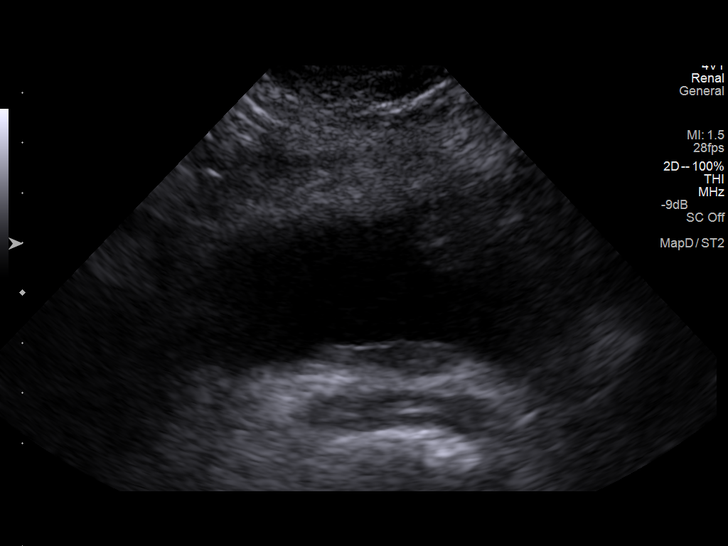
[im 40/40]
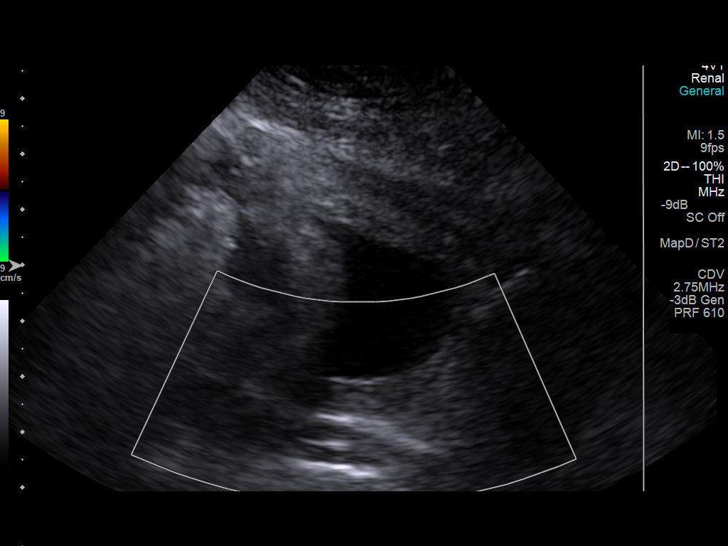

[14 of 25 positions shown; findings below may reference images not displayed]

FINDINGS: Right Kidney

Length: 9.6 cm. Cortical thinning. Upper normal cortical
echogenicity. No mass, hydronephrosis or shadowing calcification.

Left Kidney

Length: 9.6 cm. Cortical thinning. Upper normal cortical
echogenicity. Question tiny cystic focus at upper pole 9 x 9 x 8 mm.
No solid mass or hydronephrosis. No shadowing calcification.

Bladder

Partially distended. Question posterior bladder wall thickening but
the bladder is incompletely distended and this could represent an
artifact.
IMPRESSION: Atrophic appearing kidneys.

Small left renal cyst.

No evidence of additional renal mass or hydronephrosis.

Cannot exclude bladder wall thickening though this could be an
artifact from underdistention; consider correlation with cystoscopy.
